# Patient Record
Sex: Female | Born: 1980 | Race: Black or African American | Hispanic: No | Marital: Single | Smoking: Never smoker
Health system: Southern US, Community
[De-identification: ages and names within clinical notes are randomized; demographics above are authoritative.]

## PROBLEM LIST (undated history)

## (undated) ENCOUNTER — Emergency Department (HOSPITAL_BASED_OUTPATIENT_CLINIC_OR_DEPARTMENT_OTHER): Admission: EM | Payer: Managed Care, Other (non HMO) | Source: Home / Self Care

## (undated) ENCOUNTER — Inpatient Hospital Stay (HOSPITAL_COMMUNITY): Payer: Self-pay

## (undated) DIAGNOSIS — Z8759 Personal history of other complications of pregnancy, childbirth and the puerperium: Secondary | ICD-10-CM

## (undated) DIAGNOSIS — Z8632 Personal history of gestational diabetes: Secondary | ICD-10-CM

## (undated) HISTORY — DX: Personal history of other complications of pregnancy, childbirth and the puerperium: Z87.59

## (undated) HISTORY — PX: NO PAST SURGERIES: SHX2092

## (undated) HISTORY — DX: Personal history of gestational diabetes: Z86.32

---

## 2014-08-06 DIAGNOSIS — F339 Major depressive disorder, recurrent, unspecified: Secondary | ICD-10-CM | POA: Insufficient documentation

## 2014-09-15 ENCOUNTER — Inpatient Hospital Stay (HOSPITAL_COMMUNITY)
Admission: AD | Admit: 2014-09-15 | Discharge: 2014-09-15 | Disposition: A | Discharge status: Home / Self Care | Payer: PRIVATE HEALTH INSURANCE | Source: Ambulatory Visit | Attending: Obstetrics and Gynecology | Admitting: Obstetrics and Gynecology

## 2014-09-15 ENCOUNTER — Encounter (HOSPITAL_COMMUNITY): Payer: Self-pay | Admitting: *Deleted

## 2014-09-15 DIAGNOSIS — O26892 Other specified pregnancy related conditions, second trimester: Secondary | ICD-10-CM | POA: Diagnosis present

## 2014-09-15 DIAGNOSIS — O219 Vomiting of pregnancy, unspecified: Secondary | ICD-10-CM

## 2014-09-15 DIAGNOSIS — Z3A15 15 weeks gestation of pregnancy: Secondary | ICD-10-CM | POA: Diagnosis not present

## 2014-09-15 DIAGNOSIS — R110 Nausea: Secondary | ICD-10-CM | POA: Insufficient documentation

## 2014-09-15 DIAGNOSIS — R109 Unspecified abdominal pain: Secondary | ICD-10-CM | POA: Diagnosis not present

## 2014-09-15 LAB — WET PREP, GENITAL
Clue Cells Wet Prep HPF POC: NONE SEEN
Trich, Wet Prep: NONE SEEN
WBC, Wet Prep HPF POC: NONE SEEN
YEAST WET PREP: NONE SEEN

## 2014-09-15 LAB — URINE MICROSCOPIC-ADD ON

## 2014-09-15 LAB — URINALYSIS, ROUTINE W REFLEX MICROSCOPIC
Bilirubin Urine: NEGATIVE
Glucose, UA: NEGATIVE mg/dL
Hgb urine dipstick: NEGATIVE
Ketones, ur: NEGATIVE mg/dL
NITRITE: NEGATIVE
PH: 7 (ref 5.0–8.0)
Protein, ur: NEGATIVE mg/dL
Specific Gravity, Urine: 1.015 (ref 1.005–1.030)
Urobilinogen, UA: 0.2 mg/dL (ref 0.0–1.0)

## 2014-09-15 LAB — POCT PREGNANCY, URINE: Preg Test, Ur: POSITIVE — AB

## 2014-09-15 LAB — HIV ANTIBODY (ROUTINE TESTING W REFLEX): HIV 1&2 Ab, 4th Generation: NONREACTIVE

## 2014-09-15 NOTE — MAU Provider Note (Signed)
History     CSN: [id number]  Arrival date and time: 09/15/14 1549   First Provider Initiated Contact with Patient 09/15/14 1635      Chief Complaint  Patient presents with  . Possible Pregnancy  . Nausea  . Abdominal Pain   HPI   Ms. Chelsea Harper is a 33 y.o. female G1P0 at 7453w0d who presents for a pregnancy test, nausea and abdominal pain. She just does not feel like doing anything since she has been pregnant. Some days are better than others. She has had some worries due to her mom passing around this time; she does not feel depressed however feels like she is dwelling on the past. She feels that her abdominal pain and nausea are related to her worrying.   OB History    Gravida Para Term Preterm AB TAB SAB Ectopic Multiple Living   1               History reviewed. No pertinent past medical history.  History reviewed. No pertinent past surgical history.  Family History  Problem Relation Age of Onset  . Diabetes Mother   . Heart disease Mother   . Cancer Mother     breast  . Hypertension Brother     History  Substance Use Topics  . Smoking status: Never Smoker   . Smokeless tobacco: Never Used  . Alcohol Use: No    Allergies: No Known Allergies  No prescriptions prior to admission   Results for orders placed or performed during the hospital encounter of 09/15/14 (from the past 48 hour(s))  Urinalysis, Routine w reflex microscopic     Status: Abnormal   Collection Time: 09/15/14  4:15 PM  Result Value Ref Range   Color, Urine YELLOW YELLOW   APPearance CLEAR CLEAR   Specific Gravity, Urine 1.015 1.005 - 1.030   pH 7.0 5.0 - 8.0   Glucose, UA NEGATIVE NEGATIVE mg/dL   Hgb urine dipstick NEGATIVE NEGATIVE   Bilirubin Urine NEGATIVE NEGATIVE   Ketones, ur NEGATIVE NEGATIVE mg/dL   Protein, ur NEGATIVE NEGATIVE mg/dL   Urobilinogen, UA 0.2 0.0 - 1.0 mg/dL   Nitrite NEGATIVE NEGATIVE   Leukocytes, UA TRACE (A) NEGATIVE  Urine microscopic-add on      Status: Abnormal   Collection Time: 09/15/14  4:15 PM  Result Value Ref Range   Squamous Epithelial / LPF FEW (A) RARE   WBC, UA 3-6 <3 WBC/hpf   RBC / HPF 0-2 <3 RBC/hpf   Bacteria, UA FEW (A) RARE  Pregnancy, urine POC     Status: Abnormal   Collection Time: 09/15/14  4:22 PM  Result Value Ref Range   Preg Test, Ur POSITIVE (A) NEGATIVE    Comment:        THE SENSITIVITY OF THIS METHODOLOGY IS >24 mIU/mL   Wet prep, genital     Status: None   Collection Time: 09/15/14  4:55 PM  Result Value Ref Range   Yeast Wet Prep HPF POC NONE SEEN NONE SEEN   Trich, Wet Prep NONE SEEN NONE SEEN   Clue Cells Wet Prep HPF POC NONE SEEN NONE SEEN   WBC, Wet Prep HPF POC NONE SEEN NONE SEEN    Comment: MODERATE BACTERIA SEEN    Review of Systems  Gastrointestinal: Positive for nausea and abdominal pain (occasional cramps; that come and goes. ). Negative for vomiting.  Psychiatric/Behavioral: Negative for depression and suicidal ideas. The patient is nervous/anxious.    Physical Exam  Blood pressure 139/82, pulse 83, temperature 98.1 F (36.7 C), temperature source Oral, resp. rate 20, height 5\' 7"  (1.702 m), weight 139.799 kg (308 lb 3.2 oz), last menstrual period 06/02/2014, SpO2 99 %.  Physical Exam  Constitutional: She is oriented to person, place, and time. She appears well-developed and well-nourished. No distress.  HENT:  Head: Normocephalic.  Eyes: Pupils are equal, round, and reactive to light.  Neck: Neck supple.  Respiratory: Effort normal.  GI: Soft. She exhibits no distension. There is no tenderness. There is no rebound.  Genitourinary:  Speculum exam: Vagina - Small amount of creamy discharge, no odor Cervix - No contact bleeding Bimanual exam: Cervix closed Uterus non tender, Gravid  Adnexa non tender, no masses bilaterally GC/Chlam, wet prep done Chaperone present for exam.   Musculoskeletal: Normal range of motion.  Neurological: She is alert and oriented to  person, place, and time.  Skin: Skin is warm. She is not diaphoretic.  Psychiatric: Her behavior is normal.    MAU Course  Procedures  None  MDM +fht  Wet prep GC HIV  Assessment and Plan   A: Nausea in pregnancy  Encounter for pregnancy test  P: Discharge home in stable condition Start prenatal care ASAP First trimester warning signs discussed Return to MAU if symptoms worsen  Debbrah AlarJennifer Irene Brandalyn Harting, NP 09/15/2014 5:34 PM

## 2014-09-15 NOTE — MAU Note (Signed)
Patient states she has had a positive home pregnancy test. States she has had nausea for 2-3 weeks but only vomited x 1. States she has been having abdominal pain on and off for about one week. Denies bleeding or discharge.

## 2014-09-15 NOTE — Discharge Instructions (Signed)

## 2014-09-16 LAB — GC/CHLAMYDIA PROBE AMP
CT PROBE, AMP APTIMA: NEGATIVE
GC Probe RNA: NEGATIVE

## 2014-09-22 ENCOUNTER — Encounter: Payer: PRIVATE HEALTH INSURANCE | Admitting: Family

## 2014-10-05 ENCOUNTER — Ambulatory Visit (INDEPENDENT_AMBULATORY_CARE_PROVIDER_SITE_OTHER): Payer: PRIVATE HEALTH INSURANCE | Admitting: Family Medicine

## 2014-10-05 ENCOUNTER — Encounter: Payer: Self-pay | Admitting: Family Medicine

## 2014-10-05 VITALS — BP 122/58 | HR 98 | Temp 98.0°F | Wt 311.5 lb

## 2014-10-05 DIAGNOSIS — Z8632 Personal history of gestational diabetes: Secondary | ICD-10-CM

## 2014-10-05 DIAGNOSIS — Z3402 Encounter for supervision of normal first pregnancy, second trimester: Secondary | ICD-10-CM

## 2014-10-05 HISTORY — DX: Personal history of gestational diabetes: Z86.32

## 2014-10-05 LAB — POCT URINALYSIS DIP (DEVICE)
Bilirubin Urine: NEGATIVE
Glucose, UA: NEGATIVE mg/dL
HGB URINE DIPSTICK: NEGATIVE
KETONES UR: NEGATIVE mg/dL
Nitrite: NEGATIVE
PH: 5 (ref 5.0–8.0)
Protein, ur: NEGATIVE mg/dL
Specific Gravity, Urine: 1.015 (ref 1.005–1.030)
Urobilinogen, UA: 0.2 mg/dL (ref 0.0–1.0)

## 2014-10-05 NOTE — Progress Notes (Signed)
C/o pressure in lower abdomen Pt states she had a normal Pap smear in May 2015 @ [facility] Initial Labs today with early 1hr gtt New OB packet given Discussed apporiate weight gain

## 2014-10-05 NOTE — Progress Notes (Signed)
   Subjective:    Chelsea Harper is a G1P0 9039w6d being seen today for her first obstetrical visit.  Her obstetrical history is significant for obesity. Patient does intend to breast feed. Pregnancy history fully reviewed.  Patient reports no bleeding, no contractions, no cramping and no leaking.  Filed Vitals:   10/05/14 1323  BP: 122/58  Pulse: 98  Temp: 98 F (36.7 C)  Weight: 311 lb 8 oz (141.295 kg)    HISTORY: OB History  Gravida Para Term Preterm AB SAB TAB Ectopic Multiple Living  1             # Outcome Date GA Lbr Len/2nd Weight Sex Delivery Anes PTL Lv  1 Current              History reviewed. No pertinent past medical history. History reviewed. No pertinent past surgical history. Family History  Problem Relation Age of Onset  . Diabetes Mother   . Heart disease Mother   . Cancer Mother     breast  . Hypertension Brother      Exam    Uterus:     Pelvic Exam: Declined  System:     Skin: normal coloration and turgor, no rashes    Neurologic: normal, gait normal; reflexes normal and symmetric   Extremities: normal strength, tone, and muscle mass   HEENT PERRLA and extra ocular movement intact   Mouth/Teeth mucous membranes moist, pharynx normal without lesions   Neck Supple, no masses   Cardiovascular: regular rate and rhythm, no murmurs or gallops   Respiratory:  appears well, vitals normal, no respiratory distress, acyanotic, normal RR, ear and throat exam is normal, neck free of mass or lymphadenopathy, chest clear, no wheezing, crepitations, rhonchi, normal symmetric air entry   Abdomen: soft, non-tender; bowel sounds normal; no masses,  no organomegaly      Assessment:    Pregnancy: G1P0 Patient Active Problem List   Diagnosis Date Noted  . Supervision of normal first pregnancy in second trimester 10/05/2014  . Morbid obesity 10/05/2014        Plan:     Initial labs drawn. Prenatal vitamins. Problem list reviewed and  updated. Genetic Screening discussed Quad Screen: ordered.  Ultrasound discussed; fetal survey: ordered.  Follow up in 4 weeks. 100% of 30 min visit spent on counseling and coordination of care.  Early 1hr GTT   STINSON, JACOB JEHIEL 10/05/2014

## 2014-10-05 NOTE — Patient Instructions (Signed)
Second Trimester of Pregnancy The second trimester is from week 13 through week 28, month 4 through 6. This is often the time in pregnancy that you feel your best. Often times, morning sickness has lessened or quit. You may have more energy, and you may get hungry more often. Your unborn baby (fetus) is growing rapidly. At the end of the sixth month, he or she is about 9 inches long and weighs about 1 pounds. You will likely feel the baby move (quickening) between 18 and 20 weeks of pregnancy. HOME CARE   Avoid all smoking, herbs, and alcohol. Avoid drugs not approved by your doctor.  Only take medicine as told by your doctor. Some medicines are safe and some are not during pregnancy.  Exercise only as told by your doctor. Stop exercising if you start having cramps.  Eat regular, healthy meals.  Wear a good support bra if your breasts are tender.  Do not use hot tubs, steam rooms, or saunas.  Wear your seat belt when driving.  Avoid raw meat, uncooked cheese, and liter boxes and soil used by cats.  Take your prenatal vitamins.  Try taking medicine that helps you poop (stool softener) as needed, and if your doctor approves. Eat more fiber by eating fresh fruit, vegetables, and whole grains. Drink enough fluids to keep your pee (urine) clear or pale yellow.  Take warm water baths (sitz baths) to soothe pain or discomfort caused by hemorrhoids. Use hemorrhoid cream if your doctor approves.  If you have puffy, bulging veins (varicose veins), wear support hose. Raise (elevate) your feet for 15 minutes, 3-4 times a day. Limit salt in your diet.  Avoid heavy lifting, wear low heals, and sit up straight.  Rest with your legs raised if you have leg cramps or low back pain.  Visit your dentist if you have not gone during your pregnancy. Use a soft toothbrush to brush your teeth. Be gentle when you floss.  You can have sex (intercourse) unless your doctor tells you not to.  Go to your  doctor visits. GET HELP IF:   You feel dizzy.  You have mild cramps or pressure in your lower belly (abdomen).  You have a nagging pain in your belly area.  You continue to feel sick to your stomach (nauseous), throw up (vomit), or have watery poop (diarrhea).  You have bad smelling fluid coming from your vagina.  You have pain with peeing (urination). GET HELP RIGHT AWAY IF:   You have a fever.  You are leaking fluid from your vagina.  You have spotting or bleeding from your vagina.  You have severe belly cramping or pain.  You lose or gain weight rapidly.  You have trouble catching your breath and have chest pain.  You notice sudden or extreme puffiness (swelling) of your face, hands, ankles, feet, or legs.  You have not felt the baby move in over an hour.  You have severe headaches that do not go away with medicine.  You have vision changes. Document Released: 01/16/2010 Document Revised: 02/16/2013 Document Reviewed: 12/23/2012 ExitCare Patient Information 2015 [employer]. This information is not intended to replace advice given to you by your health care provider. Make sure you discuss any questions you have with your health care provider.  

## 2014-10-06 ENCOUNTER — Telehealth: Payer: Self-pay | Admitting: *Deleted

## 2014-10-06 DIAGNOSIS — O280 Abnormal hematological finding on antenatal screening of mother: Secondary | ICD-10-CM

## 2014-10-06 LAB — PRENATAL PROFILE (SOLSTAS)
ANTIBODY SCREEN: NEGATIVE
Basophils Absolute: 0 10*3/uL (ref 0.0–0.1)
Basophils Relative: 0 % (ref 0–1)
EOS PCT: 2 % (ref 0–5)
Eosinophils Absolute: 0.3 10*3/uL (ref 0.0–0.7)
HCT: 31.4 % — ABNORMAL LOW (ref 36.0–46.0)
HEMOGLOBIN: 10.7 g/dL — AB (ref 12.0–15.0)
HIV 1&2 Ab, 4th Generation: NONREACTIVE
Hepatitis B Surface Ag: NEGATIVE
LYMPHS ABS: 2.5 10*3/uL (ref 0.7–4.0)
LYMPHS PCT: 18 % (ref 12–46)
MCH: 26.8 pg (ref 26.0–34.0)
MCHC: 34.1 g/dL (ref 30.0–36.0)
MCV: 78.7 fL (ref 78.0–100.0)
MONO ABS: 0.4 10*3/uL (ref 0.1–1.0)
MONOS PCT: 3 % (ref 3–12)
MPV: 9.4 fL (ref 9.4–12.4)
Neutro Abs: 10.5 10*3/uL — ABNORMAL HIGH (ref 1.7–7.7)
Neutrophils Relative %: 77 % (ref 43–77)
Platelets: 297 10*3/uL (ref 150–400)
RBC: 3.99 MIL/uL (ref 3.87–5.11)
RDW: 14.9 % (ref 11.5–15.5)
RUBELLA: 0.91 {index} — AB (ref <0.90)
Rh Type: POSITIVE
WBC: 13.7 10*3/uL — ABNORMAL HIGH (ref 4.0–10.5)

## 2014-10-06 LAB — GLUCOSE TOLERANCE, 1 HOUR (50G) W/O FASTING: GLUCOSE 1 HOUR GTT: 153 mg/dL — AB (ref 70–140)

## 2014-10-06 LAB — AFP TUMOR MARKER: AFP-Tumor Marker: 19.6 ng/mL — ABNORMAL HIGH (ref <6.1)

## 2014-10-06 NOTE — Telephone Encounter (Signed)
Also noted per chart review quad screen results came back elevated. Discussed with Dr. Adrian BlackwaterStinson - need to order genetic counseling. Order placed.and appointment 10/08/14 2:15 complete ultrsound inMFM and 3:00 genetic counseling  . [place] [facility] and left message we are callling with results - please call clinic.  Need to also schedule 3 hr . Gtt

## 2014-10-06 NOTE — Telephone Encounter (Signed)
-----   Message from Chelsea HeritageJacob J Stinson, DO sent at 10/06/2014 11:17 AM EST ----- Patient's 1 hr GTT was elevated.  Please call patient to have her come in for a 3 hr GTT.

## 2014-10-06 NOTE — Progress Notes (Signed)
Quick Note:  Patient's 1 hr GTT was elevated. Please call patient to have her come in for a 3 hr GTT. ______ 

## 2014-10-07 LAB — PRESCRIPTION MONITORING PROFILE (19 PANEL)
Amphetamine/Meth: NEGATIVE ng/mL
BUPRENORPHINE, URINE: NEGATIVE ng/mL
Barbiturate Screen, Urine: NEGATIVE ng/mL
Benzodiazepine Screen, Urine: NEGATIVE ng/mL
CANNABINOID SCRN UR: NEGATIVE ng/mL
Carisoprodol, Urine: NEGATIVE ng/mL
Cocaine Metabolites: NEGATIVE ng/mL
Creatinine, Urine: 151.96 mg/dL (ref >20.0)
ECSTASY: NEGATIVE ng/mL
Fentanyl, Ur: NEGATIVE ng/mL
MEPERIDINE UR: NEGATIVE ng/mL
METHADONE SCREEN, URINE: NEGATIVE ng/mL
METHAQUALONE SCREEN (URINE): NEGATIVE ng/mL
Nitrites, Initial: NEGATIVE ug/mL
Opiate Screen, Urine: NEGATIVE ng/mL
Oxycodone Screen, Ur: NEGATIVE ng/mL
PHENCYCLIDINE, UR: NEGATIVE ng/mL
Propoxyphene: NEGATIVE ng/mL
TAPENTADOLUR: NEGATIVE ng/mL
Tramadol Scrn, Ur: NEGATIVE ng/mL
Zolpidem, Urine: NEGATIVE ng/mL
pH, Initial: 5.9 pH (ref 4.5–8.9)

## 2014-10-07 LAB — CULTURE, OB URINE: Colony Count: >=100000

## 2014-10-07 LAB — HEMOGLOBINOPATHY EVALUATION
HGB A2 QUANT: 3.1 % (ref 2.2–3.2)
HGB A: 55 % — AB (ref 96.8–97.8)
Hemoglobin Other: 0 %
Hgb F Quant: 0.9 % (ref 0.0–2.0)
Hgb S Quant: 41 % — ABNORMAL HIGH

## 2014-10-07 NOTE — Telephone Encounter (Signed)
Called home number and left message we are calling about an appointment we have made for tomorrow- please call clinic so we can explain.  Also called work number left message with female for Chelsea Harper to call clinic.

## 2014-10-08 ENCOUNTER — Ambulatory Visit (HOSPITAL_COMMUNITY): Payer: PRIVATE HEALTH INSURANCE

## 2014-10-11 NOTE — Telephone Encounter (Signed)
Chelsea HeritageJacob J Stinson, DO  P [facility]           Can you have the patient come in to get this - Quad screen needs to be ordered. The wrong test had been ordered before.  Thanks!  Gerilyn PilgrimJacob

## 2014-10-11 NOTE — Telephone Encounter (Signed)
Per Dr. Samson FredericStinson Emrys needs both quad screen and to keep appointment for genetic counseling/ ultrasound

## 2014-10-11 NOTE — Telephone Encounter (Signed)
Patient Demographics     Patient Name Sex DOB SSN Address Phone    Chelsea Harper, Chelsea Harper Female 02/03/1981 [id number] [place] [id number] [phone] [facility](Home) [phone] (Work)      AFP tumor marker for patient  Received: Today    Chelsea Harper  Levie HeritageJacob J Stinson, DO Cc: Simona HuhLinda Lusk Tranisha Tissue, RN           Hi,   I was looking ahead and see that this patient is scheduled to come for genetic counseling with me and detailed ultrasound this Thursday. It looks like instead of a Quad screen though that she had an AFP tumor marker performed instead. I wasn't sure if somebody there would be in contact with her prior to her scheduled visit with us and if so, if the patient would prefer to come back to your office to have the Quad screen done. If not, then we can offer for her to have a Quad screen performed when she is here.   Thanks!   Clydie BraunKaren

## 2014-10-11 NOTE — Telephone Encounter (Addendum)
Called Chelsea Harper to notify her  needs quad screen, genetic counseling and ultrasound- left message for her to call clinic and ask to speak to a nurse. Also called work number and left message with female for Chelsea Harper to call clinic.

## 2014-10-12 NOTE — Telephone Encounter (Signed)
Called patient, no answer- left message that we are trying to reach you with results and information about an appt, please call us back at the clinics. Called patient's emergency contact (sister) and she states she will have [place] us

## 2014-10-12 NOTE — Telephone Encounter (Signed)
Patient called back and I informed her of 1 hr gtt results and need for 3 hr. Patient states she will have to work something out with her job and call us back. Explained importance of 3 hr and importance of being tested soon. Patient verbalized understanding and will call back to schedule. Informed patient of AFP results and appts on Thursday. Patient verbalized understanding and had no questions

## 2014-10-14 ENCOUNTER — Other Ambulatory Visit: Payer: Self-pay | Admitting: Family Medicine

## 2014-10-14 ENCOUNTER — Ambulatory Visit (HOSPITAL_COMMUNITY)
Admission: RE | Admit: 2014-10-14 | Discharge: 2014-10-14 | Disposition: A | Discharge status: Home / Self Care | Payer: PRIVATE HEALTH INSURANCE | Source: Ambulatory Visit | Attending: Family Medicine | Admitting: Family Medicine

## 2014-10-14 ENCOUNTER — Ambulatory Visit (HOSPITAL_COMMUNITY): Payer: PRIVATE HEALTH INSURANCE

## 2014-10-14 ENCOUNTER — Encounter (HOSPITAL_COMMUNITY): Payer: Self-pay

## 2014-10-14 DIAGNOSIS — Z36 Encounter for antenatal screening of mother: Secondary | ICD-10-CM | POA: Diagnosis not present

## 2014-10-14 DIAGNOSIS — O352XX1 Maternal care for (suspected) hereditary disease in fetus, fetus 1: Secondary | ICD-10-CM

## 2014-10-14 DIAGNOSIS — O280 Abnormal hematological finding on antenatal screening of mother: Secondary | ICD-10-CM | POA: Insufficient documentation

## 2014-10-14 DIAGNOSIS — O99212 Obesity complicating pregnancy, second trimester: Secondary | ICD-10-CM | POA: Diagnosis not present

## 2014-10-14 DIAGNOSIS — D573 Sickle-cell trait: Secondary | ICD-10-CM | POA: Insufficient documentation

## 2014-10-14 DIAGNOSIS — Z3402 Encounter for supervision of normal first pregnancy, second trimester: Secondary | ICD-10-CM

## 2014-10-14 DIAGNOSIS — Z3A19 19 weeks gestation of pregnancy: Secondary | ICD-10-CM | POA: Insufficient documentation

## 2014-10-14 DIAGNOSIS — O99012 Anemia complicating pregnancy, second trimester: Secondary | ICD-10-CM | POA: Insufficient documentation

## 2014-10-14 DIAGNOSIS — Z315 Encounter for genetic counseling: Secondary | ICD-10-CM | POA: Diagnosis not present

## 2014-10-14 DIAGNOSIS — Z3A18 18 weeks gestation of pregnancy: Secondary | ICD-10-CM | POA: Insufficient documentation

## 2014-10-14 DIAGNOSIS — O99210 Obesity complicating pregnancy, unspecified trimester: Secondary | ICD-10-CM | POA: Insufficient documentation

## 2014-10-14 LAB — QUAD SCREEN FOR MFM

## 2014-10-14 NOTE — Progress Notes (Signed)
Genetic Counseling  High-Risk Gestation Note  Appointment Date:  10/14/2014 Referred By: Truett Mainland, DO Date of Birth:  [birth year]   Pregnancy History: G1P0 Estimated Date of Delivery: 03/09/15 Estimated Gestational Age: 66w1dAttending: PBenjaman Lobe MD  I met with Chelsea Harper for genetic counseling given that she has sickle cell trait. She was accompanied to today's visit by her sister and niece. She was also referred given her AFP lab results.  [name] referred for an elevated AFP. However, we discussed that AFP tumor marker was analyzed, which is not recommended in pregnancy, and not maternal serum AFP/Quad screen, which can assess the risk for birth defects and chromosome conditions in the pregnancy. Thus, she is not considered to have an abnormal maternal serum AFP result in the pregnancy. We discussed the option of Quad screen including the conditions for which it screens, detection rates, and false positive rates. She understands that Quad screening is not diagnostic nor does it assess for all birth defects or genetic conditions. After careful consideration, she elected to proceed with Quad screen today.   We discussed that hemoglobin electrophoresis performed indicated that Chelsea Harper has sickle cell trait. She reported that she is aware that she has sickle cell trait.  We discussed that Sickle Cell anemia (SCA) is a hemoglobinopathy in which there is an inherited structural abnormality in one of the globin chains, specifically the beta globin chain.  It is characterized by episodes of vaso-occlusive crisis and chronic anemia due to the tendency of red blood cells to become deformed under conditions of decreased oxygen tension.  The basic defect in SCA involves the change of a single amino acid altering the configuration of the hemoglobin molecule causing the cells to sickle and to obstruct blood flow in small vessels.  Ischemia of tissues and organs results.  Increased cell  fragility with increased phagocytosis and splenic sequestration of the fragile cells produces anemia.    SCA (Hgb SS) is inherited as an autosomal recessive condition.  The carrier state is Hgb AS. Carriers of recessive conditions typically do not have symptoms related to the condition because they still have one functioning copy of the gene, and thus some production of the typical protein coded for by that gene. We discussed the 1 in 4 (25%) chance with each pregnancy to have a child with SCA when both parents are carriers for sickle cell trait or other hemoglobin variant.  There is also a 1 in 4 chance to have a child who is not a carrier, or affected with SCA (Hgb AA) and a 1 in 2 chance to have a child who is a carrier for SCA (Hgb AS).  We reviewed that early diagnosis of SCA is facilitated by newborn screening before the onset of symptoms.  Clinical manifestations usually present in the first or second year of life.  We discussed the option of amniocentesis as a way to determine, prenatally, if a baby has SCA or not, when both parents are identified to be carriers.  A risk of 1 in 300-500 was given for amniocentesis, the primary complication being spontaneous pregnancy loss, or at this late gestation, preterm delivery.  She understands that although the ultrasound may appear normal, the risk of anomalies cannot be completely eliminated, and that a baby with SCA would not appear different on a prenatal ultrasound.    Given the recessive inheritance, we discussed the importance of understanding the carrier status of the father of the pregnancy in order to  accurately predict the risk of a hemoglobinopathy in the fetus. The patient did not have information regarding whether or not he has had sickle cell screening in the past. She did not know of any relatives in his family with sickle cell trait or sickle cell anemia. He reportedly has African American ancestry, and consanguinity to Chelsea Harper was denied.  Prior to carrier screening for the father of the pregnancy, he would have the general population risk of approximately 1 in 12 to carry sickle cell trait. Thus, the risk for sickle cell anemia in the pregnancy, prior to carrier screening for him, is approximately 1 in 38. We discussed that screening for sickle cell trait and other hemoglobin variants, such as hemoglobin C, is available to him via hemoglobin electrophoresis. Chelsea Harper may contact us if the father of the pregnancy would like [place] to facilitate sickle cell screening for him.    Both family histories were reviewed and found to be otherwise noncontributory for birth defects, intellectual disability, and known genetic conditions.  Without further information regarding the provided family history, an accurate genetic risk cannot be calculated. Further genetic counseling is warranted if more information is obtained.  Chelsea Harper denied exposure to environmental toxins or chemical agents. She denied the use of alcohol, tobacco or street drugs. She denied significant viral illnesses during the course of her pregnancy. Her medical and surgical histories were noncontributory.   I counseled Chelsea Harper for approximately 30 minutes regarding the above risks and available options.    Chipper Oman, MS Certified Genetic Counselor 10/14/2014

## 2014-10-15 ENCOUNTER — Other Ambulatory Visit: Payer: PRIVATE HEALTH INSURANCE

## 2014-10-18 ENCOUNTER — Other Ambulatory Visit: Payer: PRIVATE HEALTH INSURANCE

## 2014-10-18 DIAGNOSIS — O99810 Abnormal glucose complicating pregnancy: Secondary | ICD-10-CM

## 2014-10-19 LAB — GLUCOSE TOLERANCE, 3 HOURS
GLUCOSE, 1 HOUR-GESTATIONAL: 149 mg/dL (ref 70–189)
GLUCOSE, 2 HOUR-GESTATIONAL: 157 mg/dL (ref 70–164)
Glucose Tolerance, Fasting: 88 mg/dL (ref 70–104)
Glucose, GTT - 3 Hour: 117 mg/dL (ref 70–144)

## 2014-10-21 ENCOUNTER — Telehealth (HOSPITAL_COMMUNITY): Payer: Self-pay | Admitting: MS"

## 2014-10-21 NOTE — Telephone Encounter (Signed)
Attempted to call patient regarding screen negative Quad screen result. Left message for patient to return call.   Clydie BraunKaren Shareeka Yim 10/21/2014 2:30 PM

## 2014-10-22 ENCOUNTER — Other Ambulatory Visit (HOSPITAL_COMMUNITY): Payer: Self-pay

## 2014-11-01 ENCOUNTER — Telehealth: Payer: Self-pay

## 2014-11-01 NOTE — Telephone Encounter (Signed)
Pt called and stated that she had a question about her lab work that was done last week. Called pt and went straight to VM.  LM that I am returning her call to please give the office a return call back.

## 2014-11-02 ENCOUNTER — Encounter: Payer: PRIVATE HEALTH INSURANCE | Admitting: Family Medicine

## 2014-11-02 NOTE — Telephone Encounter (Signed)
Called patient, no answer- left message that we are trying to return your phone call and if you still need assistance, please call us back at the clinics

## 2014-11-05 NOTE — L&D Delivery Note (Signed)
Patient is 34 y.o. G1P0 7085w0d admitted IOL 2/2 gHTN and A1GDM  Delivery Note At 3:14 AM a viable female was delivered via Vaginal, Spontaneous Delivery (Presentation: Right Occiput Anterior).  APGAR: 4, 9; weight pending.  Infant placed on mother's abdomen, stimulated and dried.  Baby with poor tone and slow to rouse initially.  Cord quickly clamped and cut by Dr Adrian BlackwaterStinson.  Baby carefully rushed to the warmer for additional stimulation.   Baby was well by 5 minutes.  Hospital cord blood sample collected.  Placenta delivered, fundal massage applied and vagina inspected for lacerations.  Placenta status: intact.  Cord: 3 vessels with the following complications: loose nuchal x1.  Dr Adrian BlackwaterStinson and Wynelle BourgeoisMarie Harper, CNM gloved and actively participated in the entirety of the delivery.  Anesthesia: Epidural  Episiotomy: None Lacerations: None Suture Repair: n/a Est. Blood Loss (mL):  150cc  Mom to postpartum.  Baby to Couplet care / Skin to Skin.  Chelsea Harper, Chelsea Harper, Chelsea Harper 03/02/2015, 3:42 AM  Delivery also attended by myself and Dr Adrian BlackwaterStinson No difficulty with shoulders Agree with note Aviva SignsMarie L Harper, CNM

## 2014-11-11 ENCOUNTER — Encounter: Payer: Self-pay | Admitting: Obstetrics and Gynecology

## 2014-11-11 ENCOUNTER — Ambulatory Visit (INDEPENDENT_AMBULATORY_CARE_PROVIDER_SITE_OTHER): Payer: PRIVATE HEALTH INSURANCE | Admitting: Obstetrics and Gynecology

## 2014-11-11 ENCOUNTER — Ambulatory Visit (HOSPITAL_COMMUNITY)
Admission: RE | Admit: 2014-11-11 | Discharge: 2014-11-11 | Disposition: A | Discharge status: Home / Self Care | Payer: PRIVATE HEALTH INSURANCE | Source: Ambulatory Visit | Attending: Family Medicine | Admitting: Family Medicine

## 2014-11-11 ENCOUNTER — Encounter (HOSPITAL_COMMUNITY): Payer: Self-pay

## 2014-11-11 VITALS — BP 121/57 | HR 87 | Wt 312.9 lb

## 2014-11-11 DIAGNOSIS — Z3402 Encounter for supervision of normal first pregnancy, second trimester: Secondary | ICD-10-CM

## 2014-11-11 DIAGNOSIS — Z36 Encounter for antenatal screening of mother: Secondary | ICD-10-CM | POA: Diagnosis not present

## 2014-11-11 DIAGNOSIS — Z3A23 23 weeks gestation of pregnancy: Secondary | ICD-10-CM | POA: Insufficient documentation

## 2014-11-11 DIAGNOSIS — D573 Sickle-cell trait: Secondary | ICD-10-CM | POA: Diagnosis not present

## 2014-11-11 DIAGNOSIS — O352XX1 Maternal care for (suspected) hereditary disease in fetus, fetus 1: Secondary | ICD-10-CM

## 2014-11-11 DIAGNOSIS — O99212 Obesity complicating pregnancy, second trimester: Secondary | ICD-10-CM | POA: Diagnosis not present

## 2014-11-11 DIAGNOSIS — O99012 Anemia complicating pregnancy, second trimester: Secondary | ICD-10-CM | POA: Insufficient documentation

## 2014-11-11 DIAGNOSIS — IMO0002 Reserved for concepts with insufficient information to code with codable children: Secondary | ICD-10-CM | POA: Insufficient documentation

## 2014-11-11 DIAGNOSIS — O352XX0 Maternal care for (suspected) hereditary disease in fetus, not applicable or unspecified: Secondary | ICD-10-CM | POA: Insufficient documentation

## 2014-11-11 DIAGNOSIS — Z0489 Encounter for examination and observation for other specified reasons: Secondary | ICD-10-CM

## 2014-11-11 LAB — POCT URINALYSIS DIP (DEVICE)
Bilirubin Urine: NEGATIVE
Glucose, UA: NEGATIVE mg/dL
Hgb urine dipstick: NEGATIVE
KETONES UR: NEGATIVE mg/dL
NITRITE: NEGATIVE
PH: 6 (ref 5.0–8.0)
Protein, ur: NEGATIVE mg/dL
Specific Gravity, Urine: 1.015 (ref 1.005–1.030)
UROBILINOGEN UA: 0.2 mg/dL (ref 0.0–1.0)

## 2014-11-11 NOTE — Progress Notes (Signed)
Reviewed labs and US. 3 hr OGTTwas WNL.  F/U anatomy today. Works [employer],.Not involved with FOB but has support. Advised to have him check sickle carrier status.

## 2014-11-11 NOTE — Patient Instructions (Signed)
Second Trimester of Pregnancy The second trimester is from week 13 through week 28, months 4 through 6. The second trimester is often a time when you feel your best. Your body has also adjusted to being pregnant, and you begin to feel better physically. Usually, morning sickness has lessened or quit completely, you may have more energy, and you may have an increase in appetite. The second trimester is also a time when the fetus is growing rapidly. At the end of the sixth month, the fetus is about 9 inches long and weighs about 1 pounds. You will likely begin to feel the baby move (quickening) between 18 and 20 weeks of the pregnancy. BODY CHANGES Your body goes through many changes during pregnancy. The changes vary from woman to woman.   Your weight will continue to increase. You will notice your lower abdomen bulging out.  You may begin to get stretch marks on your hips, abdomen, and breasts.  You may develop headaches that can be relieved by medicines approved by your health care provider.  You may urinate more often because the fetus is pressing on your bladder.  You may develop or continue to have heartburn as a result of your pregnancy.  You may develop constipation because certain hormones are causing the muscles that push waste through your intestines to slow down.  You may develop hemorrhoids or swollen, bulging veins (varicose veins).  You may have back pain because of the weight gain and pregnancy hormones relaxing your joints between the bones in your pelvis and as a result of a shift in weight and the muscles that support your balance.  Your breasts will continue to grow and be tender.  Your gums may bleed and may be sensitive to brushing and flossing.  Dark spots or blotches (chloasma, mask of pregnancy) may develop on your face. This will likely fade after the baby is born.  A dark line from your belly button to the pubic area (linea nigra) may appear. This will likely fade  after the baby is born.  You may have changes in your hair. These can include thickening of your hair, rapid growth, and changes in texture. Some women also have hair loss during or after pregnancy, or hair that feels dry or thin. Your hair will most likely return to normal after your baby is born. WHAT TO EXPECT AT YOUR PRENATAL VISITS During a routine prenatal visit:  You will be weighed to make sure you and the fetus are growing normally.  Your blood pressure will be taken.  Your abdomen will be measured to track your baby's growth.  The fetal heartbeat will be listened to.  Any test results from the previous visit will be discussed. Your health care provider may ask you:  How you are feeling.  If you are feeling the baby move.  If you have had any abnormal symptoms, such as leaking fluid, bleeding, severe headaches, or abdominal cramping.  If you have any questions. Other tests that may be performed during your second trimester include:  Blood tests that check for:  Low iron levels (anemia).  Gestational diabetes (between 24 and 28 weeks).  Rh antibodies.  Urine tests to check for infections, diabetes, or protein in the urine.  An ultrasound to confirm the proper growth and development of the baby.  An amniocentesis to check for possible genetic problems.  Fetal screens for spina bifida and Down syndrome. HOME CARE INSTRUCTIONS   Avoid all smoking, herbs, alcohol, and unprescribed   drugs. These chemicals affect the formation and growth of the baby.  Follow your health care provider's instructions regarding medicine use. There are medicines that are either safe or unsafe to take during pregnancy.  Exercise only as directed by your health care provider. Experiencing uterine cramps is a good sign to stop exercising.  Continue to eat regular, healthy meals.  Wear a good support bra for breast tenderness.  Do not use hot tubs, steam rooms, or saunas.  Wear your  seat belt at all times when driving.  Avoid raw meat, uncooked cheese, cat litter boxes, and soil used by cats. These carry germs that can cause birth defects in the baby.  Take your prenatal vitamins.  Try taking a stool softener (if your health care provider approves) if you develop constipation. Eat more high-fiber foods, such as fresh vegetables or fruit and whole grains. Drink plenty of fluids to keep your urine clear or pale yellow.  Take warm sitz baths to soothe any pain or discomfort caused by hemorrhoids. Use hemorrhoid cream if your health care provider approves.  If you develop varicose veins, wear support hose. Elevate your feet for 15 minutes, 3-4 times a day. Limit salt in your diet.  Avoid heavy lifting, wear low heel shoes, and practice good posture.  Rest with your legs elevated if you have leg cramps or low back pain.  Visit your dentist if you have not gone yet during your pregnancy. Use a soft toothbrush to brush your teeth and be gentle when you floss.  A sexual relationship may be continued unless your health care provider directs you otherwise.  Continue to go to all your prenatal visits as directed by your health care provider. SEEK MEDICAL CARE IF:   You have dizziness.  You have mild pelvic cramps, pelvic pressure, or nagging pain in the abdominal area.  You have persistent nausea, vomiting, or diarrhea.  You have a bad smelling vaginal discharge.  You have pain with urination. SEEK IMMEDIATE MEDICAL CARE IF:   You have a fever.  You are leaking fluid from your vagina.  You have spotting or bleeding from your vagina.  You have severe abdominal cramping or pain.  You have rapid weight gain or loss.  You have shortness of breath with chest pain.  You notice sudden or extreme swelling of your face, hands, ankles, feet, or legs.  You have not felt your baby move in over an hour.  You have severe headaches that do not go away with  medicine.  You have vision changes. Document Released: 10/16/2001 Document Revised: 10/27/2013 Document Reviewed: 12/23/2012 Exit[employer] Patient Information 2015 [employer]. This information is not intended to replace advice given to you by your health care provider. Make sure you discuss any questions you have with your health care provider.  

## 2014-12-09 ENCOUNTER — Ambulatory Visit (INDEPENDENT_AMBULATORY_CARE_PROVIDER_SITE_OTHER): Payer: PRIVATE HEALTH INSURANCE | Admitting: Family Medicine

## 2014-12-09 VITALS — BP 121/67 | HR 101 | Temp 97.8°F | Wt 314.2 lb

## 2014-12-09 DIAGNOSIS — Z3402 Encounter for supervision of normal first pregnancy, second trimester: Secondary | ICD-10-CM

## 2014-12-09 LAB — POCT URINALYSIS DIP (DEVICE)
BILIRUBIN URINE: NEGATIVE
GLUCOSE, UA: NEGATIVE mg/dL
Hgb urine dipstick: NEGATIVE
KETONES UR: NEGATIVE mg/dL
Nitrite: NEGATIVE
PH: 6 (ref 5.0–8.0)
Protein, ur: NEGATIVE mg/dL
Specific Gravity, Urine: 1.015 (ref 1.005–1.030)
Urobilinogen, UA: 0.2 mg/dL (ref 0.0–1.0)

## 2014-12-09 LAB — CBC
HCT: 31.6 % — ABNORMAL LOW (ref 36.0–46.0)
HEMOGLOBIN: 10.4 g/dL — AB (ref 12.0–15.0)
MCH: 27.4 pg (ref 26.0–34.0)
MCHC: 32.9 g/dL (ref 30.0–36.0)
MCV: 83.4 fL (ref 78.0–100.0)
MPV: 9.2 fL (ref 8.6–12.4)
Platelets: 290 10*3/uL (ref 150–400)
RBC: 3.79 MIL/uL — AB (ref 3.87–5.11)
RDW: 15 % (ref 11.5–15.5)
WBC: 12.8 10*3/uL — AB (ref 4.0–10.5)

## 2014-12-09 MED ORDER — TETANUS-DIPHTH-ACELL PERTUSSIS 5-2.5-18.5 LF-MCG/0.5 IM SUSP: 0.5000 mL | Freq: Once | INTRAMUSCULAR | Status: DC

## 2014-12-09 NOTE — Addendum Note (Signed)
Addended by: Gerome ApleyZEYFANG, Rodgers Likes L on: 12/09/2014 12:19 PM   Modules accepted: Orders

## 2014-12-09 NOTE — Progress Notes (Signed)
C/o clear discharge.

## 2014-12-09 NOTE — Progress Notes (Signed)
Patient is 34 y.o. G1P0 11058w1d.  +FM, denies LOF, VB, contractions, vaginal discharge.  Overall feeling well. - has growth sono scheduled

## 2014-12-10 LAB — HIV ANTIBODY (ROUTINE TESTING W REFLEX): HIV 1&2 Ab, 4th Generation: NONREACTIVE

## 2014-12-10 LAB — RPR

## 2014-12-10 LAB — GLUCOSE TOLERANCE, 1 HOUR (50G) W/O FASTING: GLUCOSE 1 HOUR GTT: 160 mg/dL — AB (ref 70–140)

## 2014-12-13 ENCOUNTER — Telehealth: Payer: Self-pay | Admitting: *Deleted

## 2014-12-13 NOTE — Telephone Encounter (Signed)
-----   Message from Perry MountKristy Rocio Acosta, MD sent at 12/11/2014  3:39 PM EST ----- Needs 2h gtt, 2nd 1h screening also abnormal (1st was abnormal with normal 2h)  ----- Message -----    From: [facility]    Sent: 12/09/2014  10:58 PM      To: Perry MountKristy Rocio Acosta, MD

## 2014-12-13 NOTE — Telephone Encounter (Signed)
Called Chelsea Harper and left a message we are calling with some information, please call clinic.

## 2014-12-13 NOTE — Telephone Encounter (Signed)
Chelsea Harper called back, explained to her she did not pass the 1 hour glucose test again and needs to repeat the 3 hr glucose test. Explained must be npo since midnight night before. She states the first date she has that she come to do this test is 12/23/14 at 8am.

## 2014-12-23 ENCOUNTER — Ambulatory Visit (HOSPITAL_COMMUNITY): Payer: PRIVATE HEALTH INSURANCE

## 2014-12-23 ENCOUNTER — Other Ambulatory Visit: Payer: PRIVATE HEALTH INSURANCE

## 2014-12-23 DIAGNOSIS — R7309 Other abnormal glucose: Secondary | ICD-10-CM

## 2014-12-24 LAB — GLUCOSE TOLERANCE, 3 HOURS
GLUCOSE, 1 HOUR-GESTATIONAL: 180 mg/dL (ref 70–189)
GLUCOSE, 2 HOUR-GESTATIONAL: 178 mg/dL — AB (ref 70–164)
Glucose Tolerance, Fasting: 106 mg/dL — ABNORMAL HIGH (ref 70–104)
Glucose, GTT - 3 Hour: 161 mg/dL — ABNORMAL HIGH (ref 70–144)

## 2014-12-27 ENCOUNTER — Ambulatory Visit: Payer: PRIVATE HEALTH INSURANCE

## 2015-01-03 ENCOUNTER — Encounter: Payer: Self-pay | Admitting: Obstetrics and Gynecology

## 2015-01-03 ENCOUNTER — Encounter: Payer: PRIVATE HEALTH INSURANCE | Admitting: Obstetrics and Gynecology

## 2015-01-04 ENCOUNTER — Encounter: Payer: Self-pay | Admitting: General Practice

## 2015-01-06 ENCOUNTER — Ambulatory Visit (INDEPENDENT_AMBULATORY_CARE_PROVIDER_SITE_OTHER): Payer: PRIVATE HEALTH INSURANCE | Admitting: Obstetrics & Gynecology

## 2015-01-06 ENCOUNTER — Encounter: Payer: PRIVATE HEALTH INSURANCE | Admitting: Obstetrics & Gynecology

## 2015-01-06 ENCOUNTER — Encounter (HOSPITAL_COMMUNITY): Payer: Self-pay

## 2015-01-06 ENCOUNTER — Ambulatory Visit (HOSPITAL_COMMUNITY)
Admission: RE | Admit: 2015-01-06 | Discharge: 2015-01-06 | Disposition: A | Discharge status: Home / Self Care | Payer: PRIVATE HEALTH INSURANCE | Source: Ambulatory Visit | Attending: Family Medicine | Admitting: Family Medicine

## 2015-01-06 VITALS — BP 114/52 | HR 91 | Temp 97.7°F | Wt 317.9 lb

## 2015-01-06 DIAGNOSIS — IMO0002 Reserved for concepts with insufficient information to code with codable children: Secondary | ICD-10-CM

## 2015-01-06 DIAGNOSIS — O99013 Anemia complicating pregnancy, third trimester: Secondary | ICD-10-CM | POA: Diagnosis not present

## 2015-01-06 DIAGNOSIS — D573 Sickle-cell trait: Secondary | ICD-10-CM | POA: Diagnosis not present

## 2015-01-06 DIAGNOSIS — Z0489 Encounter for examination and observation for other specified reasons: Secondary | ICD-10-CM

## 2015-01-06 DIAGNOSIS — O99212 Obesity complicating pregnancy, second trimester: Secondary | ICD-10-CM

## 2015-01-06 DIAGNOSIS — O24913 Unspecified diabetes mellitus in pregnancy, third trimester: Secondary | ICD-10-CM

## 2015-01-06 DIAGNOSIS — O99213 Obesity complicating pregnancy, third trimester: Secondary | ICD-10-CM | POA: Insufficient documentation

## 2015-01-06 DIAGNOSIS — Z3A31 31 weeks gestation of pregnancy: Secondary | ICD-10-CM | POA: Insufficient documentation

## 2015-01-06 LAB — POCT URINALYSIS DIP (DEVICE)
Bilirubin Urine: NEGATIVE
GLUCOSE, UA: NEGATIVE mg/dL
Ketones, ur: NEGATIVE mg/dL
NITRITE: NEGATIVE
PH: 6 (ref 5.0–8.0)
PROTEIN: NEGATIVE mg/dL
SPECIFIC GRAVITY, URINE: 1.015 (ref 1.005–1.030)
Urobilinogen, UA: 0.2 mg/dL (ref 0.0–1.0)

## 2015-01-06 NOTE — Progress Notes (Signed)
Nutrition note: GDM diet education Pt has h/o obesity & is a newly diagnosed GDM pt. Pt has gained 17.9# @ 10413w1d, which is > expected. Pt reports eating 3 meals & 3-4 snacks/d. Pt is taking a PNV. Pt reports nausea & heartburn occ but no vomiting. NKFA. 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 Pt received verbal & written education on GDM diet. Pt to f/u with Harriett SineNancy (diabetes educator) on Monday to get meter/ learn how to check BS. Discussed wt gain goals of 11-20# or 0.5#/wk. Pt agrees to follow GDM diet with 3 meals & 3 snacks/d with proper CHO/ protein combination. Pt has WIC & plans to BF. F/u in 2-4 wks Blondell RevealLaura Tannar Broker, MS, RD, LDN, [facility]

## 2015-01-06 NOTE — Progress Notes (Signed)
Needs diabetic teaching

## 2015-01-06 NOTE — Progress Notes (Signed)
States feels queasy in the morning sometimes- started last week.

## 2015-01-06 NOTE — Patient Instructions (Signed)
Gestational Diabetes Mellitus Gestational diabetes mellitus, often simply referred to as gestational diabetes, is a type of diabetes that some women develop during pregnancy. In gestational diabetes, the pancreas does not make enough insulin (a hormone), the cells are less responsive to the insulin that is made (insulin resistance), or both.Normally, insulin moves sugars from food into the tissue cells. The tissue cells use the sugars for energy. The lack of insulin or the lack of normal response to insulin causes excess sugars to build up in the blood instead of going into the tissue cells. As a result, high blood sugar (hyperglycemia) develops. The effect of high sugar (glucose) levels can cause many problems.  RISK FACTORS You have an increased chance of developing gestational diabetes if you have a family history of diabetes and also have one or more of the following risk factors:  A body mass index over 30 (obesity).  A previous pregnancy with gestational diabetes.  An older age at the time of pregnancy. If blood glucose levels are kept in the normal range during pregnancy, women can have a healthy pregnancy. If your blood glucose levels are not well controlled, there may be risks to you, your unborn baby (fetus), your labor and delivery, or your newborn baby.  SYMPTOMS  If symptoms are experienced, they are much like symptoms you would normally expect during pregnancy. The symptoms of gestational diabetes include:   Increased thirst (polydipsia).  Increased urination (polyuria).  Increased urination during the night (nocturia).  Weight loss. This weight loss may be rapid.  Frequent, recurring infections.  Tiredness (fatigue).  Weakness.  Vision changes, such as blurred vision.  Fruity smell to your breath.  Abdominal pain. DIAGNOSIS Diabetes is diagnosed when blood glucose levels are increased. Your blood glucose level may be checked by one or more of the following blood  tests:  A fasting blood glucose test. You will not be allowed to eat for at least 8 hours before a blood sample is taken.  A random blood glucose test. Your blood glucose is checked at any time of the day regardless of when you ate.  A hemoglobin A1c blood glucose test. A hemoglobin A1c test provides information about blood glucose control over the previous 3 months.  An oral glucose tolerance test (OGTT). Your blood glucose is measured after you have not eaten (fasted) for 1-3 hours and then after you drink a glucose-containing beverage. Since the hormones that cause insulin resistance are highest at about 24-28 weeks of a pregnancy, an OGTT is usually performed during that time. If you have risk factors for gestational diabetes, your health care provider may test you for gestational diabetes earlier than 24 weeks of pregnancy. TREATMENT   You will need to take diabetes medicine or insulin daily to keep blood glucose levels in the desired range.  You will need to match insulin dosing with exercise and healthy food choices. The treatment goal is to maintain the before-meal (preprandial), bedtime, and overnight blood glucose level at 60-99 mg/dL during pregnancy. The treatment goal is to further maintain peak after-meal blood sugar (postprandial glucose) level at 100-140 mg/dL. HOME CARE INSTRUCTIONS   Have your hemoglobin A1c level checked twice a year.  Perform daily blood glucose monitoring as directed by your health care provider. It is common to perform frequent blood glucose monitoring.  Monitor urine ketones when you are ill and as directed by your health care provider.  Take your diabetes medicine and insulin as directed by your health care provider   to maintain your blood glucose level in the desired range.  Never run out of diabetes medicine or insulin. It is needed every day.  Adjust insulin based on your intake of carbohydrates. Carbohydrates can raise blood glucose levels but  need to be included in your diet. Carbohydrates provide vitamins, minerals, and fiber which are an essential part of a healthy diet. Carbohydrates are found in fruits, vegetables, whole grains, dairy products, legumes, and foods containing added sugars.  Eat healthy foods. Alternate 3 meals with 3 snacks.  Maintain a healthy weight gain. The usual total expected weight gain varies according to your prepregnancy body mass index (BMI).  Carry a medical alert card or wear your medical alert jewelry.  Carry a 15-gram carbohydrate snack with you at all times to treat low blood glucose (hypoglycemia). Some examples of 15-gram carbohydrate snacks include:  Glucose tablets, 3 or 4.  Glucose gel, 15-gram tube.  Raisins, 2 tablespoons (24 g).  Jelly beans, 6.  Animal crackers, 8.  Fruit juice, regular soda, or low-fat milk, 4 ounces (120 mL).  Gummy treats, 9.  Recognize hypoglycemia. Hypoglycemia during pregnancy occurs with blood glucose levels of 60 mg/dL and below. The risk for hypoglycemia increases when fasting or skipping meals, during or after intense exercise, and during sleep. Hypoglycemia symptoms can include:  Tremors or shakes.  Decreased ability to concentrate.  Sweating.  Increased heart rate.  Headache.  Dry mouth.  Hunger.  Irritability.  Anxiety.  Restless sleep.  Altered speech or coordination.  Confusion.  Treat hypoglycemia promptly. If you are alert and able to safely swallow, follow the 15:15 rule:  Take 15-20 grams of rapid-acting glucose or carbohydrate. Rapid-acting options include glucose gel, glucose tablets, or 4 ounces (120 mL) of fruit juice, regular soda, or low-fat milk.  Check your blood glucose level 15 minutes after taking the glucose.  Take 15-20 grams more of glucose if the repeat blood glucose level is still 70 mg/dL or below.  Eat a meal or snack within 1 hour once blood glucose levels return to normal.  Be alert to polyuria  (excess urination) and polydipsia (excess thirst) which are early signs of hyperglycemia. An early awareness of hyperglycemia allows for prompt treatment. Treat hyperglycemia as directed by your health care provider.  Engage in at least 30 minutes of physical activity a day or as directed by your health care provider. Ten minutes of physical activity timed 30 minutes after each meal is encouraged to control postprandial blood glucose levels.  Adjust your insulin dosing and food intake as needed if you start a new exercise or sport.  Follow your sick-day plan at any time you are unable to eat or drink as usual.  Avoid tobacco and alcohol use.  Keep all follow-up visits as directed by your health care provider.  Follow the advice of your health care provider regarding your prenatal and post-delivery (postpartum) appointments, meal planning, exercise, medicines, vitamins, blood tests, other medical tests, and physical activities.  Perform daily skin and foot care. Examine your skin and feet daily for cuts, bruises, redness, nail problems, bleeding, blisters, or sores.  Brush your teeth and gums at least twice a day and floss at least once a day. Follow up with your dentist regularly.  Schedule an eye exam during the first trimester of your pregnancy or as directed by your health care provider.  Share your diabetes management plan with your workplace or school.  Stay up-to-date with immunizations.  Learn to manage stress.    Obtain ongoing diabetes education and support as needed.  Learn about and consider breastfeeding your baby.  You should have your blood sugar level checked 6-12 weeks after delivery. This is done with an oral glucose tolerance test (OGTT). SEEK MEDICAL CARE IF:   You are unable to eat food or drink fluids for more than 6 hours.  You have nausea and vomiting for more than 6 hours.  You have a blood glucose level of 200 mg/dL and you have ketones in your  urine.  There is a change in mental status.  You develop vision problems.  You have a persistent headache.  You have upper abdominal pain or discomfort.  You develop an additional serious illness.  You have diarrhea for more than 6 hours.  You have been sick or have had a fever for a couple of days and are not getting better. SEEK IMMEDIATE MEDICAL CARE IF:   You have difficulty breathing.  You no longer feel the baby moving.  You are bleeding or have discharge from your vagina.  You start having premature contractions or labor. MAKE SURE YOU:  Understand these instructions.  Will watch your condition.  Will get help right away if you are not doing well or get worse. Document Released: 01/28/2001 Document Revised: 03/08/2014 Document Reviewed: 05/20/2012 Exit[employer] Patient Information 2015 [employer]. This information is not intended to replace advice given to you by your health care provider. Make sure you discuss any questions you have with your health care provider.  

## 2015-01-10 ENCOUNTER — Ambulatory Visit: Payer: PRIVATE HEALTH INSURANCE | Admitting: *Deleted

## 2015-01-10 NOTE — Progress Notes (Signed)
Failed for appointment for glucose testing instruction

## 2015-01-19 ENCOUNTER — Other Ambulatory Visit (HOSPITAL_COMMUNITY): Payer: Self-pay | Admitting: Obstetrics and Gynecology

## 2015-01-19 DIAGNOSIS — O352XX0 Maternal care for (suspected) hereditary disease in fetus, not applicable or unspecified: Secondary | ICD-10-CM

## 2015-01-19 DIAGNOSIS — O99210 Obesity complicating pregnancy, unspecified trimester: Principal | ICD-10-CM

## 2015-01-24 ENCOUNTER — Encounter: Payer: Medicaid Other | Attending: Family Medicine | Admitting: *Deleted

## 2015-01-24 ENCOUNTER — Ambulatory Visit (INDEPENDENT_AMBULATORY_CARE_PROVIDER_SITE_OTHER): Payer: PRIVATE HEALTH INSURANCE | Admitting: Family Medicine

## 2015-01-24 VITALS — BP 110/64 | HR 90 | Temp 97.5°F | Wt 318.5 lb

## 2015-01-24 DIAGNOSIS — Z713 Dietary counseling and surveillance: Secondary | ICD-10-CM | POA: Insufficient documentation

## 2015-01-24 DIAGNOSIS — O289 Unspecified abnormal findings on antenatal screening of mother: Secondary | ICD-10-CM

## 2015-01-24 DIAGNOSIS — O280 Abnormal hematological finding on antenatal screening of mother: Secondary | ICD-10-CM

## 2015-01-24 DIAGNOSIS — O24913 Unspecified diabetes mellitus in pregnancy, third trimester: Secondary | ICD-10-CM | POA: Insufficient documentation

## 2015-01-24 LAB — POCT URINALYSIS DIP (DEVICE)
BILIRUBIN URINE: NEGATIVE
GLUCOSE, UA: 100 mg/dL — AB
Hgb urine dipstick: NEGATIVE
KETONES UR: NEGATIVE mg/dL
Nitrite: NEGATIVE
Protein, ur: NEGATIVE mg/dL
SPECIFIC GRAVITY, URINE: 1.015 (ref 1.005–1.030)
Urobilinogen, UA: 0.2 mg/dL (ref 0.0–1.0)
pH: 6 (ref 5.0–8.0)

## 2015-01-24 MED ORDER — GLUCOSE BLOOD VI STRP: ORAL_STRIP | Status: DC

## 2015-01-24 MED ORDER — ACCU-CHEK FASTCLIX LANCETS MISC: 1.0000 | Freq: Four times a day (QID) | Status: DC

## 2015-01-24 MED ORDER — ACCU-CHEK NANO SMARTVIEW W/DEVICE KIT: 1.0000 | PACK | Freq: Four times a day (QID) | Status: DC

## 2015-01-24 NOTE — Progress Notes (Signed)
  Patient was seen on 01/24/15 for Gestational Diabetes self-management . [place] previously met with Nutritionist. However, this morning she had cereal, fruit and milk for breakfast (no protein). We reviewed basic nutritional guidelines.  The following learning objectives were met by the patient :   States the definition of Gestational Diabetes  States when to check blood glucose levels  Demonstrates proper blood glucose monitoring techniques  States the effect of stress and exercise on blood glucose levels  States the importance of limiting caffeine and abstaining from alcohol and smoking  Plan:  Decrease carbohydrate intake and have protein with all meals and snacks Consider  increasing your activity level by walking daily as tolerated Begin checking BG before breakfast and 2 hours after first bit of breakfast, lunch and dinner after  as directed by MD  Take medication  as directed by MD  Order sent to [facility] for Accu-Chek Nano and testing supplies  Patient instructed to monitor glucose levels: FBS: 60 - <90 2 hour: <120  Patient will be seen for follow-up as needed.

## 2015-01-24 NOTE — Addendum Note (Signed)
Addended by: Rosendo GrosHALPIN, Cailynn Bodnar L on: 01/24/2015 12:04 PM   Modules accepted: Orders

## 2015-01-24 NOTE — Progress Notes (Signed)
Glucose reading after breakfast 118mg /dl at time of Diabetes consult

## 2015-01-24 NOTE — Progress Notes (Signed)
Patient is 34 y.o. G1P0 [redacted]w[redacted]d  +FM, denies LOF, VB, contractions, vaginal discharge.  Overall feeling well. GDM: reports does not have meter yet, to meet with diabetic educator today.  Previously met with nutrition.

## 2015-01-31 ENCOUNTER — Ambulatory Visit (INDEPENDENT_AMBULATORY_CARE_PROVIDER_SITE_OTHER): Payer: PRIVATE HEALTH INSURANCE | Admitting: Obstetrics and Gynecology

## 2015-01-31 VITALS — BP 129/71 | HR 92 | Temp 97.8°F | Wt 317.7 lb

## 2015-01-31 DIAGNOSIS — O24913 Unspecified diabetes mellitus in pregnancy, third trimester: Secondary | ICD-10-CM

## 2015-01-31 DIAGNOSIS — O24410 Gestational diabetes mellitus in pregnancy, diet controlled: Secondary | ICD-10-CM

## 2015-01-31 LAB — POCT URINALYSIS DIP (DEVICE)
Bilirubin Urine: NEGATIVE
Glucose, UA: NEGATIVE mg/dL
HGB URINE DIPSTICK: NEGATIVE
Ketones, ur: NEGATIVE mg/dL
NITRITE: NEGATIVE
Protein, ur: NEGATIVE mg/dL
Specific Gravity, Urine: 1.015 (ref 1.005–1.030)
UROBILINOGEN UA: 0.2 mg/dL (ref 0.0–1.0)
pH: 6 (ref 5.0–8.0)

## 2015-01-31 NOTE — Progress Notes (Signed)
34 y.o. G1P0 at 4962w5d here for routine visit. Doing well today. No concerns.  1. GDM, diet controlled. Left blood glucose log at home. Patient reports blood sugars between 70s-80s first thing in the morning, after meals mostly 110-120, few > 120. Highest is 146. May need to start medications, stressed the importance of bring blood glucose log to clinic next week for review. Continue diet and exercise. Growth ultrasound at 1350w1d EFW 1585 gm (42%ile). Repeat growth scan scheduled for 3/31. 2. Routine PNC. Vaginal swabs next week. FM/PTL precautions reviewed. RTC 1 week.

## 2015-01-31 NOTE — Progress Notes (Signed)
C/o occasional pelvic discomfort and difficulty sleeping.  Edema at ankles.

## 2015-02-03 ENCOUNTER — Ambulatory Visit (HOSPITAL_COMMUNITY)
Admission: RE | Admit: 2015-02-03 | Discharge: 2015-02-03 | Disposition: A | Discharge status: Home / Self Care | Payer: PRIVATE HEALTH INSURANCE | Source: Ambulatory Visit | Attending: Family Medicine | Admitting: Family Medicine

## 2015-02-03 ENCOUNTER — Encounter (HOSPITAL_COMMUNITY): Payer: Self-pay

## 2015-02-03 DIAGNOSIS — O24410 Gestational diabetes mellitus in pregnancy, diet controlled: Secondary | ICD-10-CM | POA: Insufficient documentation

## 2015-02-03 DIAGNOSIS — O99213 Obesity complicating pregnancy, third trimester: Secondary | ICD-10-CM | POA: Diagnosis not present

## 2015-02-03 DIAGNOSIS — O99210 Obesity complicating pregnancy, unspecified trimester: Secondary | ICD-10-CM

## 2015-02-03 DIAGNOSIS — O352XX0 Maternal care for (suspected) hereditary disease in fetus, not applicable or unspecified: Secondary | ICD-10-CM

## 2015-02-03 DIAGNOSIS — Z3A35 35 weeks gestation of pregnancy: Secondary | ICD-10-CM | POA: Insufficient documentation

## 2015-02-07 ENCOUNTER — Encounter: Payer: Self-pay | Admitting: Family Medicine

## 2015-02-07 ENCOUNTER — Ambulatory Visit (INDEPENDENT_AMBULATORY_CARE_PROVIDER_SITE_OTHER): Payer: PRIVATE HEALTH INSURANCE | Admitting: Family Medicine

## 2015-02-07 VITALS — BP 114/73 | HR 94 | Temp 97.5°F | Wt 320.0 lb

## 2015-02-07 DIAGNOSIS — O24913 Unspecified diabetes mellitus in pregnancy, third trimester: Secondary | ICD-10-CM

## 2015-02-07 LAB — POCT URINALYSIS DIP (DEVICE)
Bilirubin Urine: NEGATIVE
GLUCOSE, UA: NEGATIVE mg/dL
Hgb urine dipstick: NEGATIVE
Ketones, ur: NEGATIVE mg/dL
Nitrite: NEGATIVE
Protein, ur: NEGATIVE mg/dL
Specific Gravity, Urine: 1.015 (ref 1.005–1.030)
UROBILINOGEN UA: 0.2 mg/dL (ref 0.0–1.0)
pH: 6.5 (ref 5.0–8.0)

## 2015-02-07 MED ORDER — ACCU-CHEK FASTCLIX LANCETS MISC: 1.0000 | Freq: Four times a day (QID) | Status: DC

## 2015-02-07 NOTE — Progress Notes (Signed)
Small leukocytes in urine.  

## 2015-02-07 NOTE — Progress Notes (Signed)
Reports no book today BS pp reports they are elevated to 130-140-150 (highest) Add exercise FBS reportedly < 90 Cultures next week U/S growth last week--5 lbs 3 oz 42%, AFI 9 cm, vtx

## 2015-02-07 NOTE — Progress Notes (Signed)
Needs rx for new insulin pen

## 2015-02-07 NOTE — Patient Instructions (Signed)
Gestational Diabetes Mellitus Gestational diabetes mellitus, often simply referred to as gestational diabetes, is a type of diabetes that some women develop during pregnancy. In gestational diabetes, the pancreas does not make enough insulin (a hormone), the cells are less responsive to the insulin that is made (insulin resistance), or both.Normally, insulin moves sugars from food into the tissue cells. The tissue cells use the sugars for energy. The lack of insulin or the lack of normal response to insulin causes excess sugars to build up in the blood instead of going into the tissue cells. As a result, high blood sugar (hyperglycemia) develops. The effect of high sugar (glucose) levels can cause many problems.  RISK FACTORS You have an increased chance of developing gestational diabetes if you have a family history of diabetes and also have one or more of the following risk factors:  A body mass index over 30 (obesity).  A previous pregnancy with gestational diabetes.  An older age at the time of pregnancy. If blood glucose levels are kept in the normal range during pregnancy, women can have a healthy pregnancy. If your blood glucose levels are not well controlled, there may be risks to you, your unborn baby (fetus), your labor and delivery, or your newborn baby.  SYMPTOMS  If symptoms are experienced, they are much like symptoms you would normally expect during pregnancy. The symptoms of gestational diabetes include:   Increased thirst (polydipsia).  Increased urination (polyuria).  Increased urination during the night (nocturia).  Weight loss. This weight loss may be rapid.  Frequent, recurring infections.  Tiredness (fatigue).  Weakness.  Vision changes, such as blurred vision.  Fruity smell to your breath.  Abdominal pain. DIAGNOSIS Diabetes is diagnosed when blood glucose levels are increased. Your blood glucose level may be checked by one or more of the following blood  tests:  A fasting blood glucose test. You will not be allowed to eat for at least 8 hours before a blood sample is taken.  A random blood glucose test. Your blood glucose is checked at any time of the day regardless of when you ate.  A hemoglobin A1c blood glucose test. A hemoglobin A1c test provides information about blood glucose control over the previous 3 months.  An oral glucose tolerance test (OGTT). Your blood glucose is measured after you have not eaten (fasted) for 1-3 hours and then after you drink a glucose-containing beverage. Since the hormones that cause insulin resistance are highest at about 24-28 weeks of a pregnancy, an OGTT is usually performed during that time. If you have risk factors for gestational diabetes, your health care provider may test you for gestational diabetes earlier than 24 weeks of pregnancy. TREATMENT   You will need to take diabetes medicine or insulin daily to keep blood glucose levels in the desired range.  You will need to match insulin dosing with exercise and healthy food choices. The treatment goal is to maintain the before-meal (preprandial), bedtime, and overnight blood glucose level at 60-99 mg/dL during pregnancy. The treatment goal is to further maintain peak after-meal blood sugar (postprandial glucose) level at 100-140 mg/dL. HOME CARE INSTRUCTIONS   Have your hemoglobin A1c level checked twice a year.  Perform daily blood glucose monitoring as directed by your health care provider. It is common to perform frequent blood glucose monitoring.  Monitor urine ketones when you are ill and as directed by your health care provider.  Take your diabetes medicine and insulin as directed by your health care provider   to maintain your blood glucose level in the desired range.  Never run out of diabetes medicine or insulin. It is needed every day.  Adjust insulin based on your intake of carbohydrates. Carbohydrates can raise blood glucose levels but  need to be included in your diet. Carbohydrates provide vitamins, minerals, and fiber which are an essential part of a healthy diet. Carbohydrates are found in fruits, vegetables, whole grains, dairy products, legumes, and foods containing added sugars.  Eat healthy foods. Alternate 3 meals with 3 snacks.  Maintain a healthy weight gain. The usual total expected weight gain varies according to your prepregnancy body mass index (BMI).  Carry a medical alert card or wear your medical alert jewelry.  Carry a 15-gram carbohydrate snack with you at all times to treat low blood glucose (hypoglycemia). Some examples of 15-gram carbohydrate snacks include:  Glucose tablets, 3 or 4.  Glucose gel, 15-gram tube.  Raisins, 2 tablespoons (24 g).  Jelly beans, 6.  Animal crackers, 8.  Fruit juice, regular soda, or low-fat milk, 4 ounces (120 mL).  Gummy treats, 9.  Recognize hypoglycemia. Hypoglycemia during pregnancy occurs with blood glucose levels of 60 mg/dL and below. The risk for hypoglycemia increases when fasting or skipping meals, during or after intense exercise, and during sleep. Hypoglycemia symptoms can include:  Tremors or shakes.  Decreased ability to concentrate.  Sweating.  Increased heart rate.  Headache.  Dry mouth.  Hunger.  Irritability.  Anxiety.  Restless sleep.  Altered speech or coordination.  Confusion.  Treat hypoglycemia promptly. If you are alert and able to safely swallow, follow the 15:15 rule:  Take 15-20 grams of rapid-acting glucose or carbohydrate. Rapid-acting options include glucose gel, glucose tablets, or 4 ounces (120 mL) of fruit juice, regular soda, or low-fat milk.  Check your blood glucose level 15 minutes after taking the glucose.  Take 15-20 grams more of glucose if the repeat blood glucose level is still 70 mg/dL or below.  Eat a meal or snack within 1 hour once blood glucose levels return to normal.  Be alert to polyuria  (excess urination) and polydipsia (excess thirst) which are early signs of hyperglycemia. An early awareness of hyperglycemia allows for prompt treatment. Treat hyperglycemia as directed by your health care provider.  Engage in at least 30 minutes of physical activity a day or as directed by your health care provider. Ten minutes of physical activity timed 30 minutes after each meal is encouraged to control postprandial blood glucose levels.  Adjust your insulin dosing and food intake as needed if you start a new exercise or sport.  Follow your sick-day plan at any time you are unable to eat or drink as usual.  Avoid tobacco and alcohol use.  Keep all follow-up visits as directed by your health care provider.  Follow the advice of your health care provider regarding your prenatal and post-delivery (postpartum) appointments, meal planning, exercise, medicines, vitamins, blood tests, other medical tests, and physical activities.  Perform daily skin and foot care. Examine your skin and feet daily for cuts, bruises, redness, nail problems, bleeding, blisters, or sores.  Brush your teeth and gums at least twice a day and floss at least once a day. Follow up with your dentist regularly.  Schedule an eye exam during the first trimester of your pregnancy or as directed by your health care provider.  Share your diabetes management plan with your workplace or school.  Stay up-to-date with immunizations.  Learn to manage stress.    Obtain ongoing diabetes education and support as needed.  Learn about and consider breastfeeding your baby.  You should have your blood sugar level checked 6-12 weeks after delivery. This is done with an oral glucose tolerance test (OGTT). SEEK MEDICAL CARE IF:   You are unable to eat food or drink fluids for more than 6 hours.  You have nausea and vomiting for more than 6 hours.  You have a blood glucose level of 200 mg/dL and you have ketones in your  urine.  There is a change in mental status.  You develop vision problems.  You have a persistent headache.  You have upper abdominal pain or discomfort.  You develop an additional serious illness.  You have diarrhea for more than 6 hours.  You have been sick or have had a fever for a couple of days and are not getting better. SEEK IMMEDIATE MEDICAL CARE IF:   You have difficulty breathing.  You no longer feel the baby moving.  You are bleeding or have discharge from your vagina.  You start having premature contractions or labor. MAKE SURE YOU:  Understand these instructions.  Will watch your condition.  Will get help right away if you are not doing well or get worse. Document Released: 01/28/2001 Document Revised: 03/08/2014 Document Reviewed: 05/20/2012 [employer] Patient Information 2015 [employer]. This information is not intended to replace advice given to you by your health care provider. Make sure you discuss any questions you have with your health care provider.  Breastfeeding Deciding to breastfeed is one of the best choices you can make for you and your baby. A change in hormones during pregnancy causes your breast tissue to grow and increases the number and size of your milk ducts. These hormones also allow proteins, sugars, and fats from your blood supply to make breast milk in your milk-producing glands. Hormones prevent breast milk from being released before your baby is born as well as prompt milk flow after birth. Once breastfeeding has begun, thoughts of your baby, as well as his or her sucking or crying, can stimulate the release of milk from your milk-producing glands.  BENEFITS OF BREASTFEEDING For Your Baby  Your first milk (colostrum) helps your baby's digestive system function better.   There are antibodies in your milk that help your baby fight off infections.   Your baby has a lower incidence of asthma, allergies, and sudden infant death  syndrome.   The nutrients in breast milk are better for your baby than infant formulas and are designed uniquely for your baby's needs.   Breast milk improves your baby's brain development.   Your baby is less likely to develop other conditions, such as childhood obesity, asthma, or type 2 diabetes mellitus.  For You   Breastfeeding helps to create a very special bond between you and your baby.   Breastfeeding is convenient. Breast milk is always available at the correct temperature and costs nothing.   Breastfeeding helps to burn calories and helps you lose the weight gained during pregnancy.   Breastfeeding makes your uterus contract to its prepregnancy size faster and slows bleeding (lochia) after you give birth.   Breastfeeding helps to lower your risk of developing type 2 diabetes mellitus, osteoporosis, and breast or ovarian cancer later in life. SIGNS THAT YOUR BABY IS HUNGRY Early Signs of Hunger  Increased alertness or activity.  Stretching.  Movement of the head from side to side.  Movement of the head and opening of the   mouth when the corner of the mouth or cheek is stroked (rooting).  Increased sucking sounds, smacking lips, cooing, sighing, or squeaking.  Hand-to-mouth movements.  Increased sucking of fingers or hands. Late Signs of Hunger  Fussing.  Intermittent crying. Extreme Signs of Hunger Signs of extreme hunger will require calming and consoling before your baby will be able to breastfeed successfully. Do not wait for the following signs of extreme hunger to occur before you initiate breastfeeding:   Restlessness.  A loud, strong cry.   Screaming. BREASTFEEDING BASICS Breastfeeding Initiation  Find a comfortable place to sit or lie down, with your neck and back well supported.  Place a pillow or rolled up blanket under your baby to bring him or her to the level of your breast (if you are seated). Nursing pillows are specially designed  to help support your arms and your baby while you breastfeed.  Make sure that your baby's abdomen is facing your abdomen.   Gently massage your breast. With your fingertips, massage from your chest wall toward your nipple in a circular motion. This encourages milk flow. You may need to continue this action during the feeding if your milk flows slowly.  Support your breast with 4 fingers underneath and your thumb above your nipple. Make sure your fingers are well away from your nipple and your baby's mouth.   Stroke your baby's lips gently with your finger or nipple.   When your baby's mouth is open wide enough, quickly bring your baby to your breast, placing your entire nipple and as much of the colored area around your nipple (areola) as possible into your baby's mouth.   More areola should be visible above your baby's upper lip than below the lower lip.   Your baby's tongue should be between his or her lower gum and your breast.   Ensure that your baby's mouth is correctly positioned around your nipple (latched). Your baby's lips should create a seal on your breast and be turned out (everted).  It is common for your baby to suck about 2-3 minutes in order to start the flow of breast milk. Latching Teaching your baby how to latch on to your breast properly is very important. An improper latch can cause nipple pain and decreased milk supply for you and poor weight gain in your baby. Also, if your baby is not latched onto your nipple properly, he or she may swallow some air during feeding. This can make your baby fussy. Burping your baby when you switch breasts during the feeding can help to get rid of the air. However, teaching your baby to latch on properly is still the best way to prevent fussiness from swallowing air while breastfeeding. Signs that your baby has successfully latched on to your nipple:    Silent tugging or silent sucking, without causing you pain.   Swallowing  heard between every 3-4 sucks.    Muscle movement above and in front of his or her ears while sucking.  Signs that your baby has not successfully latched on to nipple:   Sucking sounds or smacking sounds from your baby while breastfeeding.  Nipple pain. If you think your baby has not latched on correctly, slip your finger into the corner of your baby's mouth to break the suction and place it between your baby's gums. Attempt breastfeeding initiation again. Signs of Successful Breastfeeding Signs from your baby:   A gradual decrease in the number of sucks or complete cessation of sucking.     Falling asleep.   Relaxation of his or her body.   Retention of a small amount of milk in his or her mouth.   Letting go of your breast by himself or herself. Signs from you:  Breasts that have increased in firmness, weight, and size 1-3 hours after feeding.   Breasts that are softer immediately after breastfeeding.  Increased milk volume, as well as a change in milk consistency and color by the fifth day of breastfeeding.   Nipples that are not sore, cracked, or bleeding. Signs That Your Baby is Getting Enough Milk  Wetting at least 3 diapers in a 24-hour period. The urine should be clear and pale yellow by age 5 days.  At least 3 stools in a 24-hour period by age 5 days. The stool should be soft and yellow.  At least 3 stools in a 24-hour period by age 7 days. The stool should be seedy and yellow.  No loss of weight greater than 10% of birth weight during the first 3 days of age.  Average weight gain of 4-7 ounces (113-198 g) per week after age 4 days.  Consistent daily weight gain by age 5 days, without weight loss after the age of 2 weeks. After a feeding, your baby may spit up a small amount. This is common. BREASTFEEDING FREQUENCY AND DURATION Frequent feeding will help you make more milk and can prevent sore nipples and breast engorgement. Breastfeed when you feel the  need to reduce the fullness of your breasts or when your baby shows signs of hunger. This is called "breastfeeding on demand." Avoid introducing a pacifier to your baby while you are working to establish breastfeeding (the first 4-6 weeks after your baby is born). After this time you may choose to use a pacifier. Research has shown that pacifier use during the first year of a baby's life decreases the risk of sudden infant death syndrome (SIDS). Allow your baby to feed on each breast as long as he or she wants. Breastfeed until your baby is finished feeding. When your baby unlatches or falls asleep while feeding from the first breast, offer the second breast. Because newborns are often sleepy in the first few weeks of life, you may need to awaken your baby to get him or her to feed. Breastfeeding times will vary from baby to baby. However, the following rules can serve as a guide to help you ensure that your baby is properly fed:  Newborns (babies 4 weeks of age or younger) may breastfeed every 1-3 hours.  Newborns should not go longer than 3 hours during the day or 5 hours during the night without breastfeeding.  You should breastfeed your baby a minimum of 8 times in a 24-hour period until you begin to introduce solid foods to your baby at around 6 months of age. BREAST MILK PUMPING Pumping and storing breast milk allows you to ensure that your baby is exclusively fed your breast milk, even at times when you are unable to breastfeed. This is especially important if you are going back to work while you are still breastfeeding or when you are not able to be present during feedings. Your lactation consultant can give you guidelines on how long it is safe to store breast milk.  A breast pump is a machine that allows you to pump milk from your breast into a sterile bottle. The pumped breast milk can then be stored in a refrigerator or freezer. Some breast pumps are operated by   hand, while others use  electricity. Ask your lactation consultant which type will work best for you. Breast pumps can be purchased, but some hospitals and breastfeeding support groups lease breast pumps on a monthly basis. A lactation consultant can teach you how to hand express breast milk, if you prefer not to use a pump.  CARING FOR YOUR BREASTS WHILE YOU BREASTFEED Nipples can become dry, cracked, and sore while breastfeeding. The following recommendations can help keep your breasts moisturized and healthy:  Avoid using soap on your nipples.   Wear a supportive bra. Although not required, special nursing bras and tank tops are designed to allow access to your breasts for breastfeeding without taking off your entire bra or top. Avoid wearing underwire-style bras or extremely tight bras.  Air dry your nipples for 3-4minutes after each feeding.   Use only cotton bra pads to absorb leaked breast milk. Leaking of breast milk between feedings is normal.   Use lanolin on your nipples after breastfeeding. Lanolin helps to maintain your skin's normal moisture barrier. If you use pure lanolin, you do not need to wash it off before feeding your baby again. Pure lanolin is not toxic to your baby. You may also hand express a few drops of breast milk and gently massage that milk into your nipples and allow the milk to air dry. In the first few weeks after giving birth, some women experience extremely full breasts (engorgement). Engorgement can make your breasts feel heavy, warm, and tender to the touch. Engorgement peaks within 3-5 days after you give birth. The following recommendations can help ease engorgement:  Completely empty your breasts while breastfeeding or pumping. You may want to start by applying warm, moist heat (in the shower or with warm water-soaked hand towels) just before feeding or pumping. This increases circulation and helps the milk flow. If your baby does not completely empty your breasts while  breastfeeding, pump any extra milk after he or she is finished.  Wear a snug bra (nursing or regular) or tank top for 1-2 days to signal your body to slightly decrease milk production.  Apply ice packs to your breasts, unless this is too uncomfortable for you.  Make sure that your baby is latched on and positioned properly while breastfeeding. If engorgement persists after 48 hours of following these recommendations, contact your health care provider or a lactation consultant. OVERALL HEALTH CARE RECOMMENDATIONS WHILE BREASTFEEDING  Eat healthy foods. Alternate between meals and snacks, eating 3 of each per day. Because what you eat affects your breast milk, some of the foods may make your baby more irritable than usual. Avoid eating these foods if you are sure that they are negatively affecting your baby.  Drink milk, fruit juice, and water to satisfy your thirst (about 10 glasses a day).   Rest often, relax, and continue to take your prenatal vitamins to prevent fatigue, stress, and anemia.  Continue breast self-awareness checks.  Avoid chewing and smoking tobacco.  Avoid alcohol and drug use. Some medicines that may be harmful to your baby can pass through breast milk. It is important to ask your health care provider before taking any medicine, including all over-the-counter and prescription medicine as well as vitamin and herbal supplements. It is possible to become pregnant while breastfeeding. If birth control is desired, ask your health care provider about options that will be safe for your baby. SEEK MEDICAL CARE IF:   You feel like you want to stop breastfeeding or have become   frustrated with breastfeeding.  You have painful breasts or nipples.  Your nipples are cracked or bleeding.  Your breasts are red, tender, or warm.  You have a swollen area on either breast.  You have a fever or chills.  You have nausea or vomiting.  You have drainage other than breast milk from  your nipples.  Your breasts do not become full before feedings by the fifth day after you give birth.  You feel sad and depressed.  Your baby is too sleepy to eat well.  Your baby is having trouble sleeping.   Your baby is wetting less than 3 diapers in a 24-hour period.  Your baby has less than 3 stools in a 24-hour period.  Your baby's skin or the white part of his or her eyes becomes yellow.   Your baby is not gaining weight by 5 days of age. SEEK IMMEDIATE MEDICAL CARE IF:   Your baby is overly tired (lethargic) and does not want to wake up and feed.  Your baby develops an unexplained fever. Document Released: 10/22/2005 Document Revised: 10/27/2013 Document Reviewed: 04/15/2013 [employer] Patient Information 2015 [employer]. This information is not intended to replace advice given to you by your health care provider. Make sure you discuss any questions you have with your health care provider.  

## 2015-02-08 ENCOUNTER — Encounter: Payer: Self-pay | Admitting: General Practice

## 2015-02-14 ENCOUNTER — Ambulatory Visit (INDEPENDENT_AMBULATORY_CARE_PROVIDER_SITE_OTHER): Payer: PRIVATE HEALTH INSURANCE | Admitting: Obstetrics & Gynecology

## 2015-02-14 ENCOUNTER — Encounter: Payer: Self-pay | Admitting: Obstetrics & Gynecology

## 2015-02-14 VITALS — BP 133/80 | HR 80 | Temp 97.7°F | Wt 319.8 lb

## 2015-02-14 DIAGNOSIS — O219 Vomiting of pregnancy, unspecified: Secondary | ICD-10-CM

## 2015-02-14 DIAGNOSIS — O24913 Unspecified diabetes mellitus in pregnancy, third trimester: Secondary | ICD-10-CM

## 2015-02-14 DIAGNOSIS — Z23 Encounter for immunization: Secondary | ICD-10-CM

## 2015-02-14 LAB — POCT URINALYSIS DIP (DEVICE)
Bilirubin Urine: NEGATIVE
Glucose, UA: NEGATIVE mg/dL
Hgb urine dipstick: NEGATIVE
Ketones, ur: NEGATIVE mg/dL
Leukocytes, UA: NEGATIVE
Nitrite: NEGATIVE
Protein, ur: NEGATIVE mg/dL
Specific Gravity, Urine: 1.015 (ref 1.005–1.030)
Urobilinogen, UA: 0.2 mg/dL (ref 0.0–1.0)
pH: 6 (ref 5.0–8.0)

## 2015-02-14 LAB — OB RESULTS CONSOLE GBS: GBS: NEGATIVE

## 2015-02-14 LAB — OB RESULTS CONSOLE GC/CHLAMYDIA
CHLAMYDIA, DNA PROBE: NEGATIVE
GC PROBE AMP, GENITAL: NEGATIVE

## 2015-02-14 MED ORDER — PROMETHAZINE HCL 25 MG PO TABS: 25.0000 mg | ORAL_TABLET | Freq: Four times a day (QID) | ORAL | Status: DC | PRN

## 2015-02-14 NOTE — Patient Instructions (Signed)
Return to clinic for any obstetric concerns or go to [facility] for evaluation  

## 2015-02-14 NOTE — Progress Notes (Signed)
Patient reports that she "does not feel well today"  . Allergies bothering her, nauseated, stomach hurting.   This is off and on.  Recommended OTC remedies, Phenergan prescribed as needed. Blood sugars are mostly within range.  EFW 42%, cephalic at 35 week scan, repeat at 39 weeks if still pregnant. Pelvic cultures today. Tdap next visit as per patient preference. No other complaints or concerns.  Labor and fetal movement precautions reviewed.

## 2015-02-15 LAB — GC/CHLAMYDIA PROBE AMP
CT Probe RNA: NEGATIVE
GC PROBE AMP APTIMA: NEGATIVE

## 2015-02-16 LAB — CULTURE, BETA STREP (GROUP B ONLY)

## 2015-02-21 ENCOUNTER — Ambulatory Visit (INDEPENDENT_AMBULATORY_CARE_PROVIDER_SITE_OTHER): Payer: PRIVATE HEALTH INSURANCE | Admitting: Obstetrics & Gynecology

## 2015-02-21 VITALS — BP 139/81 | HR 84 | Temp 97.2°F | Wt 323.3 lb

## 2015-02-21 DIAGNOSIS — Z23 Encounter for immunization: Secondary | ICD-10-CM | POA: Diagnosis not present

## 2015-02-21 DIAGNOSIS — O24913 Unspecified diabetes mellitus in pregnancy, third trimester: Secondary | ICD-10-CM

## 2015-02-21 LAB — POCT URINALYSIS DIP (DEVICE)
BILIRUBIN URINE: NEGATIVE
Glucose, UA: NEGATIVE mg/dL
Hgb urine dipstick: NEGATIVE
KETONES UR: NEGATIVE mg/dL
Nitrite: NEGATIVE
Protein, ur: NEGATIVE mg/dL
SPECIFIC GRAVITY, URINE: 1.015 (ref 1.005–1.030)
Urobilinogen, UA: 0.2 mg/dL (ref 0.0–1.0)
pH: 6 (ref 5.0–8.0)

## 2015-02-21 MED ORDER — TETANUS-DIPHTH-ACELL PERTUSSIS 5-2.5-18.5 LF-MCG/0.5 IM SUSP
0.5000 mL | Freq: Once | INTRAMUSCULAR | Status: AC
  Administered 2015-02-21: 0.5 mL via INTRAMUSCULAR

## 2015-02-21 NOTE — Addendum Note (Signed)
Addended by: Gerome ApleyZEYFANG, Inger Wiest L on: 02/21/2015 11:29 AM   Modules accepted: Orders

## 2015-02-21 NOTE — Progress Notes (Signed)
Fastings--all nml.  Pp are nml except for pp lunch whidh are in 120s.  Continue diet.

## 2015-02-21 NOTE — Patient Instructions (Signed)

## 2015-02-21 NOTE — Progress Notes (Signed)
U/S 03/03/15 @ 145p with Radiology.

## 2015-02-24 ENCOUNTER — Ambulatory Visit (HOSPITAL_COMMUNITY): Payer: PRIVATE HEALTH INSURANCE

## 2015-02-28 ENCOUNTER — Encounter: Payer: Self-pay | Admitting: Obstetrics and Gynecology

## 2015-02-28 ENCOUNTER — Encounter (HOSPITAL_COMMUNITY): Payer: Self-pay

## 2015-02-28 ENCOUNTER — Inpatient Hospital Stay (HOSPITAL_COMMUNITY)
Admission: AD | Admit: 2015-02-28 | Discharge: 2015-03-04 | DRG: 775 | Disposition: A | Discharge status: Home / Self Care | Payer: PRIVATE HEALTH INSURANCE | Source: Ambulatory Visit | Attending: Family Medicine | Admitting: Family Medicine

## 2015-02-28 ENCOUNTER — Telehealth (HOSPITAL_COMMUNITY): Payer: Self-pay | Admitting: *Deleted

## 2015-02-28 ENCOUNTER — Ambulatory Visit (INDEPENDENT_AMBULATORY_CARE_PROVIDER_SITE_OTHER): Payer: PRIVATE HEALTH INSURANCE | Admitting: Obstetrics and Gynecology

## 2015-02-28 VITALS — BP 143/85 | HR 87 | Temp 97.8°F | Wt 327.9 lb

## 2015-02-28 DIAGNOSIS — O99213 Obesity complicating pregnancy, third trimester: Secondary | ICD-10-CM

## 2015-02-28 DIAGNOSIS — Z3A38 38 weeks gestation of pregnancy: Secondary | ICD-10-CM | POA: Diagnosis present

## 2015-02-28 DIAGNOSIS — O133 Gestational [pregnancy-induced] hypertension without significant proteinuria, third trimester: Secondary | ICD-10-CM | POA: Diagnosis present

## 2015-02-28 DIAGNOSIS — IMO0001 Reserved for inherently not codable concepts without codable children: Secondary | ICD-10-CM

## 2015-02-28 DIAGNOSIS — Z3A39 39 weeks gestation of pregnancy: Secondary | ICD-10-CM | POA: Diagnosis not present

## 2015-02-28 DIAGNOSIS — O99214 Obesity complicating childbirth: Secondary | ICD-10-CM | POA: Diagnosis present

## 2015-02-28 DIAGNOSIS — O24913 Unspecified diabetes mellitus in pregnancy, third trimester: Secondary | ICD-10-CM

## 2015-02-28 DIAGNOSIS — Z6841 Body Mass Index (BMI) 40.0 and over, adult: Secondary | ICD-10-CM | POA: Diagnosis not present

## 2015-02-28 DIAGNOSIS — O24420 Gestational diabetes mellitus in childbirth, diet controlled: Secondary | ICD-10-CM | POA: Diagnosis present

## 2015-02-28 DIAGNOSIS — Z8759 Personal history of other complications of pregnancy, childbirth and the puerperium: Secondary | ICD-10-CM | POA: Diagnosis present

## 2015-02-28 DIAGNOSIS — O24410 Gestational diabetes mellitus in pregnancy, diet controlled: Secondary | ICD-10-CM

## 2015-02-28 HISTORY — DX: Personal history of other complications of pregnancy, childbirth and the puerperium: Z87.59

## 2015-02-28 LAB — URINALYSIS, ROUTINE W REFLEX MICROSCOPIC
Bilirubin Urine: NEGATIVE
Glucose, UA: NEGATIVE mg/dL
Hgb urine dipstick: NEGATIVE
Ketones, ur: NEGATIVE mg/dL
Leukocytes, UA: NEGATIVE
Nitrite: NEGATIVE
Protein, ur: NEGATIVE mg/dL
Specific Gravity, Urine: 1.01 (ref 1.005–1.030)
Urobilinogen, UA: 0.2 mg/dL (ref 0.0–1.0)
pH: 5.5 (ref 5.0–8.0)

## 2015-02-28 LAB — CBC
HEMATOCRIT: 30.3 % — AB (ref 36.0–46.0)
Hemoglobin: 10.3 g/dL — ABNORMAL LOW (ref 12.0–15.0)
MCH: 27.8 pg (ref 26.0–34.0)
MCHC: 34 g/dL (ref 30.0–36.0)
MCV: 81.9 fL (ref 78.0–100.0)
PLATELETS: 273 10*3/uL (ref 150–400)
RBC: 3.7 MIL/uL — ABNORMAL LOW (ref 3.87–5.11)
RDW: 16.5 % — ABNORMAL HIGH (ref 11.5–15.5)
WBC: 12.2 10*3/uL — ABNORMAL HIGH (ref 4.0–10.5)

## 2015-02-28 LAB — COMPREHENSIVE METABOLIC PANEL
ALBUMIN: 2.5 g/dL — AB (ref 3.5–5.2)
ALT: 12 U/L (ref 0–35)
AST: 20 U/L (ref 0–37)
Alkaline Phosphatase: 147 U/L — ABNORMAL HIGH (ref 39–117)
Anion gap: 6 (ref 5–15)
BILIRUBIN TOTAL: 0.2 mg/dL — AB (ref 0.3–1.2)
BUN: 8 mg/dL (ref 6–23)
CALCIUM: 8.8 mg/dL (ref 8.4–10.5)
CHLORIDE: 109 mmol/L (ref 96–112)
CO2: 21 mmol/L (ref 19–32)
Creatinine, Ser: 1.09 mg/dL (ref 0.50–1.10)
GFR, EST AFRICAN AMERICAN: 77 mL/min — AB (ref >90)
GFR, EST NON AFRICAN AMERICAN: 66 mL/min — AB (ref >90)
Glucose, Bld: 97 mg/dL (ref 70–99)
Potassium: 4.8 mmol/L (ref 3.5–5.1)
Sodium: 136 mmol/L (ref 135–145)
TOTAL PROTEIN: 5.9 g/dL — AB (ref 6.0–8.3)

## 2015-02-28 LAB — GLUCOSE, CAPILLARY
Glucose-Capillary: 78 mg/dL (ref 70–99)
Glucose-Capillary: 140 mg/dL — ABNORMAL HIGH (ref 70–99)
Glucose-Capillary: 109 mg/dL — ABNORMAL HIGH (ref 70–99)

## 2015-02-28 LAB — TYPE AND SCREEN
ABO/RH(D): O POS
Antibody Screen: NEGATIVE

## 2015-02-28 LAB — POCT URINALYSIS DIP (DEVICE)
Bilirubin Urine: NEGATIVE
Glucose, UA: NEGATIVE mg/dL
Hgb urine dipstick: NEGATIVE
Ketones, ur: NEGATIVE mg/dL
Nitrite: NEGATIVE
Protein, ur: NEGATIVE mg/dL
Specific Gravity, Urine: 1.01 (ref 1.005–1.030)
Urobilinogen, UA: 0.2 mg/dL (ref 0.0–1.0)
pH: 5 (ref 5.0–8.0)

## 2015-02-28 LAB — PROTEIN / CREATININE RATIO, URINE
Creatinine, Urine: 66 mg/dL
Protein Creatinine Ratio: 0.17 — ABNORMAL HIGH (ref 0.00–0.15)
Total Protein, Urine: 11 mg/dL

## 2015-02-28 LAB — ABO/RH: ABO/RH(D): O POS

## 2015-02-28 MED ORDER — LACTATED RINGERS IV SOLN
500.0000 mL | INTRAVENOUS | Status: DC | PRN
  Administered 2015-03-01: 1000 mL via INTRAVENOUS
  Administered 2015-03-01: 300 mL via INTRAVENOUS
  Administered 2015-03-01: 250 mL via INTRAVENOUS
  Administered 2015-03-02 (×2): 300 mL via INTRAVENOUS

## 2015-02-28 MED ORDER — ONDANSETRON HCL 4 MG/2ML IJ SOLN
4.0000 mg | Freq: Four times a day (QID) | INTRAMUSCULAR | Status: DC | PRN
  Administered 2015-03-01: 4 mg via INTRAVENOUS
  Filled 2015-02-28: qty 2

## 2015-02-28 MED ORDER — OXYTOCIN BOLUS FROM INFUSION
500.0000 mL | INTRAVENOUS | Status: DC
  Administered 2015-03-02: 500 mL via INTRAVENOUS

## 2015-02-28 MED ORDER — CITRIC ACID-SODIUM CITRATE 334-500 MG/5ML PO SOLN
30.0000 mL | ORAL | Status: DC | PRN
  Filled 2015-02-28: qty 15

## 2015-02-28 MED ORDER — MISOPROSTOL 25 MCG QUARTER TABLET
25.0000 ug | ORAL_TABLET | ORAL | Status: DC | PRN
  Administered 2015-02-28 (×3): 25 ug via VAGINAL
  Filled 2015-02-28 (×3): qty 0.25
  Filled 2015-02-28: qty 1
  Filled 2015-02-28 (×2): qty 0.25

## 2015-02-28 MED ORDER — LACTATED RINGERS IV SOLN
INTRAVENOUS | Status: DC
  Administered 2015-02-28 – 2015-03-02 (×7): via INTRAVENOUS

## 2015-02-28 MED ORDER — OXYCODONE-ACETAMINOPHEN 5-325 MG PO TABS: 1.0000 | ORAL_TABLET | ORAL | Status: DC | PRN

## 2015-02-28 MED ORDER — OXYTOCIN 40 UNITS IN LACTATED RINGERS INFUSION - SIMPLE MED: 62.5000 mL/h | INTRAVENOUS | Status: DC

## 2015-02-28 MED ORDER — TERBUTALINE SULFATE 1 MG/ML IJ SOLN: 0.2500 mg | Freq: Once | INTRAMUSCULAR | Status: AC | PRN

## 2015-02-28 MED ORDER — LIDOCAINE HCL (PF) 1 % IJ SOLN
30.0000 mL | INTRAMUSCULAR | Status: DC | PRN
  Filled 2015-02-28: qty 30

## 2015-02-28 MED ORDER — ACETAMINOPHEN 325 MG PO TABS: 650.0000 mg | ORAL_TABLET | ORAL | Status: DC | PRN

## 2015-02-28 MED ORDER — OXYCODONE-ACETAMINOPHEN 5-325 MG PO TABS: 2.0000 | ORAL_TABLET | ORAL | Status: DC | PRN

## 2015-02-28 NOTE — [facility] Note (Signed)
Urine in lab 

## 2015-02-28 NOTE — Progress Notes (Signed)
Chelsea Harper is a 34 y.o. G1P0 at 191w5d admitted for induction of labor due to Hypertension.  Subjective: Denies discomfort at this time.  Objective: BP 138/71 mmHg  Pulse 85  Temp(Src) 98.3 F (36.8 C) (Oral)  Resp 18  Ht 5' 6.5" (1.689 m)  Wt 148.326 kg (327 lb)  BMI 51.99 kg/m2  LMP 06/02/2014 (Approximate)    FHT:  FHR: 140 bpm, variability: moderate,  accelerations:  Present,  decelerations:  Absent UC:   Irregular/mild  SVE:   Dilation: Closed Effacement (%): Thick Station: Ballotable Exam by:: Rosemarie AxL Ross CNM student  Labs: Lab Results  Component Value Date   WBC 12.2* 02/28/2015   HGB 10.3* 02/28/2015   HCT 30.3* 02/28/2015   MCV 81.9 02/28/2015   PLT 273 02/28/2015   Results for orders placed or performed during the hospital encounter of 02/28/15 (from the past 24 hour(s))  Urinalysis, Routine w reflex microscopic     Status: None   Collection Time: 02/28/15 10:50 AM  Result Value Ref Range   Color, Urine YELLOW YELLOW   APPearance CLEAR CLEAR   Specific Gravity, Urine 1.010 1.005 - 1.030   pH 5.5 5.0 - 8.0   Glucose, UA NEGATIVE NEGATIVE mg/dL   Hgb urine dipstick NEGATIVE NEGATIVE   Bilirubin Urine NEGATIVE NEGATIVE   Ketones, ur NEGATIVE NEGATIVE mg/dL   Protein, ur NEGATIVE NEGATIVE mg/dL   Urobilinogen, UA 0.2 0.0 - 1.0 mg/dL   Nitrite NEGATIVE NEGATIVE   Leukocytes, UA NEGATIVE NEGATIVE  Protein / creatinine ratio, urine     Status: Abnormal   Collection Time: 02/28/15 10:50 AM  Result Value Ref Range   Creatinine, Urine 66.00 mg/dL   Total Protein, Urine 11 mg/dL   Protein Creatinine Ratio 0.17 (H) 0.00 - 0.15  CBC     Status: Abnormal   Collection Time: 02/28/15 12:35 PM  Result Value Ref Range   WBC 12.2 (H) 4.0 - 10.5 K/uL   RBC 3.70 (L) 3.87 - 5.11 MIL/uL   Hemoglobin 10.3 (L) 12.0 - 15.0 g/dL   HCT 16.130.3 (L) 09.636.0 - 04.546.0 %   MCV 81.9 78.0 - 100.0 fL   MCH 27.8 26.0 - 34.0 pg   MCHC 34.0 30.0 - 36.0 g/dL   RDW 40.916.5 (H) 81.111.5 - 91.415.5 %    Platelets 273 150 - 400 K/uL  Comprehensive metabolic panel     Status: Abnormal   Collection Time: 02/28/15 12:35 PM  Result Value Ref Range   Sodium 136 135 - 145 mmol/L   Potassium 4.8 3.5 - 5.1 mmol/L   Chloride 109 96 - 112 mmol/L   CO2 21 19 - 32 mmol/L   Glucose, Bld 97 70 - 99 mg/dL   BUN 8 6 - 23 mg/dL   Creatinine, Ser 7.821.09 0.50 - 1.10 mg/dL   Calcium 8.8 8.4 - 95.610.5 mg/dL   Total Protein 5.9 (L) 6.0 - 8.3 g/dL   Albumin 2.5 (L) 3.5 - 5.2 g/dL   AST 20 0 - 37 U/L   ALT 12 0 - 35 U/L   Alkaline Phosphatase 147 (H) 39 - 117 U/L   Total Bilirubin 0.2 (L) 0.3 - 1.2 mg/dL   GFR calc non Af Amer 66 (L) >90 mL/min   GFR calc Af Amer 77 (L) >90 mL/min   Anion gap 6 5 - 15  Type and screen     Status: None   Collection Time: 02/28/15 12:35 PM  Result Value Ref Range   ABO/RH(D)  O POS    Antibody Screen NEG    Sample Expiration 03/03/2015   ABO/Rh     Status: None   Collection Time: 02/28/15 12:35 PM  Result Value Ref Range   ABO/RH(D) O POS   Glucose, capillary     Status: None   Collection Time: 02/28/15  2:43 PM  Result Value Ref Range   Glucose-Capillary 78 70 - 99 mg/dL  Glucose, capillary     Status: Abnormal   Collection Time: 02/28/15  6:55 PM  Result Value Ref Range   Glucose-Capillary 140 (H) 70 - 99 mg/dL   Comment 1 Notify RN      Assessment / Plan: Cervical ripening prior to IOL  Labor: Not in labor yet. Continue cytotec for cervical ripening. Fetal Wellbeing:  Category I Pain Control:  Labor support without medications Pre-eclampsia: Labs & BP stable I/D:  GBS negative Anticipated MOD:  NSVD  Ross,Lisa Wynne SNM 02/28/2015, 7:16 PM   Seen also by me Agree with note Aviva Signs, CNM

## 2015-02-28 NOTE — Progress Notes (Signed)
C/o hands/wrists hurt/ tingly. C/o occcasional headaches. Denies visual changes.

## 2015-02-28 NOTE — Progress Notes (Signed)
Trace leuks in urine.  

## 2015-02-28 NOTE — Telephone Encounter (Signed)
Preadmission screen  

## 2015-02-28 NOTE — Progress Notes (Signed)
Patient is doing well without complaints. FM/labor precautions reviewed. Patient with elevated BP today, reports occasional headaches relieved by tylenol, no visual changes. Patient did not bring meter or log book this morning but reports highest fasting in 70's and pp values ranging from 115-120. Will schedule IOL at 40 weeks on 5/4 Will send to MAU for further evaluation of elevated BP Growth ultrasound scheduled on 4/28

## 2015-02-28 NOTE — Progress Notes (Signed)
IOL 03/09/15 @ 730p

## 2015-02-28 NOTE — H&P (Signed)
Chelsea Harper is a 34 y.o. female presenting for Induction of Labor. Maternal Medical History:  Reason for admission: Nausea. [name] who presents with gestational hypertension. Labs pending, no proteinuria. ALso a diet controlled diabetic (well controlled).   Contractions: Frequency: rare.   Perceived severity is mild.    Fetal activity: Perceived fetal activity is normal.   Last perceived fetal movement was within the past hour.    Prenatal complications: PIH.   No bleeding or pre-eclampsia (work up in process).   Prenatal Complications - Diabetes: gestational. Diabetes is managed by diet.      OB History    Gravida Para Term Preterm AB TAB SAB Ectopic Multiple Living   1              Past Medical History  Diagnosis Date  . Gestational diabetes    Past Surgical History  Procedure Laterality Date  . No past surgeries     Family History: family history includes Cancer in her mother; Diabetes in her mother; Heart disease in her mother; Hypertension in her brother. Social History:  reports that she has never smoked. She has never used smokeless tobacco. She reports that she does not drink alcohol or use illicit drugs.   Review of Systems  Constitutional: Negative for fever, chills and malaise/fatigue.  Eyes: Negative for blurred vision and double vision.  Gastrointestinal: Negative for nausea, vomiting, abdominal pain, diarrhea and constipation.  Neurological: Positive for headaches. Negative for dizziness, focal weakness and weakness.      Blood pressure 130/75, pulse 79, temperature 98.1 F (36.7 C), temperature source Oral, resp. rate 20, last menstrual period 06/02/2014. Maternal Exam:  Uterine Assessment: Contraction strength is mild.  Contraction frequency is rare.   Abdomen: Patient reports no abdominal tenderness. Estimated fetal weight is 8++.    Introitus: Normal vulva. Normal vagina.  Ferning test: not done.  Nitrazine test: not  done. Amniotic fluid character: not assessed.  Pelvis: adequate for delivery.   Cervix: Cervix evaluated by digital exam.     Fetal Exam Fetal Monitor Review: Mode: ultrasound.   Baseline rate: 140.  Variability: moderate (6-25 bpm).   Pattern: accelerations present and no decelerations.    Fetal State Assessment: Category I - tracings are normal.     Physical Exam  Constitutional: She is oriented to person, place, and time. She appears well-developed and well-nourished. No distress.  HENT:  Head: Normocephalic.  Cardiovascular: Normal rate, regular rhythm and normal heart sounds.  Exam reveals no gallop and no friction rub.   No murmur heard. Respiratory: Effort normal. No respiratory distress. She has no wheezes. She has no rales. She exhibits no tenderness.  GI: Soft. She exhibits no distension. There is no tenderness. There is no rebound and no guarding.  Musculoskeletal: Normal range of motion.  Neurological: She is alert and oriented to person, place, and time.  Skin: Skin is warm and dry.  Psychiatric: She has a normal mood and affect.    Prenatal labs: ABO, Rh: O/POS/-- (12/01 1436) Antibody: NEG (12/01 1436) Rubella: 0.91 (12/01 1436) RPR: NON REAC (02/04 1718)  HBsAg: NEGATIVE (12/01 1436)  HIV: NONREACTIVE (02/04 1718)  GBS: Negative (04/11 0000)   Assessment/Plan: A:  SIUP at [redacted]w[redacted]d       Gestational Hypertension      R/O Preeclampsia      Diet Controlled Diabetes  P:  Consulted Dr Chelsea Harper       Admit to [employer] [employer]  Recommend induction of labor due to Gest HTN       Will probably require Cytotec ripening Will check cx when admitted   [name],Chelsea 02/28/2015, 12:30 PM

## 2015-02-28 NOTE — MAU Note (Signed)
Has been stressed; dad was rushed to hosp and is not doing well.  BP and swelling are new things. No headache now;  Denies visual changes and epigastric pain.

## 2015-03-01 ENCOUNTER — Inpatient Hospital Stay (HOSPITAL_COMMUNITY): Payer: PRIVATE HEALTH INSURANCE | Admitting: Anesthesiology

## 2015-03-01 ENCOUNTER — Encounter (HOSPITAL_COMMUNITY): Payer: Self-pay | Admitting: Anesthesiology

## 2015-03-01 LAB — GLUCOSE, CAPILLARY
GLUCOSE-CAPILLARY: 89 mg/dL (ref 70–99)
GLUCOSE-CAPILLARY: 106 mg/dL — AB (ref 70–99)
Glucose-Capillary: 119 mg/dL — ABNORMAL HIGH (ref 70–99)
Glucose-Capillary: 106 mg/dL — ABNORMAL HIGH (ref 70–99)
Glucose-Capillary: 103 mg/dL — ABNORMAL HIGH (ref 70–99)

## 2015-03-01 LAB — CBC
HEMATOCRIT: 31.1 % — AB (ref 36.0–46.0)
HEMOGLOBIN: 10.3 g/dL — AB (ref 12.0–15.0)
MCH: 27.2 pg (ref 26.0–34.0)
MCHC: 33.1 g/dL (ref 30.0–36.0)
MCV: 82.3 fL (ref 78.0–100.0)
PLATELETS: 261 10*3/uL (ref 150–400)
RBC: 3.78 MIL/uL — ABNORMAL LOW (ref 3.87–5.11)
RDW: 16.5 % — AB (ref 11.5–15.5)
WBC: 14.6 10*3/uL — AB (ref 4.0–10.5)

## 2015-03-01 LAB — RPR: RPR Ser Ql: NONREACTIVE

## 2015-03-01 LAB — HIV ANTIBODY (ROUTINE TESTING W REFLEX): HIV Screen 4th Generation wRfx: NONREACTIVE

## 2015-03-01 MED ORDER — OXYTOCIN 40 UNITS IN LACTATED RINGERS INFUSION - SIMPLE MED
1.0000 m[IU]/min | INTRAVENOUS | Status: DC
  Administered 2015-03-01: 1 m[IU]/min via INTRAVENOUS
  Administered 2015-03-01: 2 m[IU]/min via INTRAVENOUS
  Administered 2015-03-01: 3 m[IU]/min via INTRAVENOUS
  Filled 2015-03-01: qty 1000

## 2015-03-01 MED ORDER — PHENYLEPHRINE 40 MCG/ML (10ML) SYRINGE FOR IV PUSH (FOR BLOOD PRESSURE SUPPORT)
80.0000 ug | PREFILLED_SYRINGE | INTRAVENOUS | Status: AC | PRN
  Administered 2015-03-01 (×3): 80 ug via INTRAVENOUS

## 2015-03-01 MED ORDER — FENTANYL 2.5 MCG/ML BUPIVACAINE 1/10 % EPIDURAL INFUSION (WH - ANES)
14.0000 mL/h | INTRAMUSCULAR | Status: DC | PRN
  Administered 2015-03-01 (×3): 14 mL/h via EPIDURAL
  Filled 2015-03-01: qty 125

## 2015-03-01 MED ORDER — TERBUTALINE SULFATE 1 MG/ML IJ SOLN: 0.2500 mg | Freq: Once | INTRAMUSCULAR | Status: DC

## 2015-03-01 MED ORDER — LACTATED RINGERS IV SOLN
INTRAVENOUS | Status: DC
  Administered 2015-03-01: 300 mL via INTRAUTERINE
  Administered 2015-03-02: 100 mL via INTRAUTERINE

## 2015-03-01 MED ORDER — EPHEDRINE 5 MG/ML INJ
10.0000 mg | INTRAVENOUS | Status: AC | PRN
  Administered 2015-03-01 (×2): 10 mg via INTRAVENOUS

## 2015-03-01 MED ORDER — LIDOCAINE HCL (PF) 1 % IJ SOLN
INTRAMUSCULAR | Status: DC | PRN
  Administered 2015-03-01: 9 mL
  Administered 2015-03-01: 1 mL

## 2015-03-01 MED ORDER — EPHEDRINE 5 MG/ML INJ
10.0000 mg | INTRAVENOUS | Status: DC | PRN
  Filled 2015-03-01: qty 2
  Filled 2015-03-01: qty 4

## 2015-03-01 MED ORDER — PHENYLEPHRINE 40 MCG/ML (10ML) SYRINGE FOR IV PUSH (FOR BLOOD PRESSURE SUPPORT)
80.0000 ug | PREFILLED_SYRINGE | INTRAVENOUS | Status: AC | PRN
  Administered 2015-03-01 (×3): 80 ug via INTRAVENOUS
  Filled 2015-03-01: qty 20

## 2015-03-01 MED ORDER — FENTANYL 2.5 MCG/ML BUPIVACAINE 1/10 % EPIDURAL INFUSION (WH - ANES)
INTRAMUSCULAR | Status: AC
  Administered 2015-03-01: 14 mL/h via EPIDURAL
  Filled 2015-03-01: qty 125

## 2015-03-01 MED ORDER — ZOLPIDEM TARTRATE 5 MG PO TABS
5.0000 mg | ORAL_TABLET | Freq: Every evening | ORAL | Status: DC | PRN
  Administered 2015-03-01: 5 mg via ORAL
  Filled 2015-03-01: qty 1

## 2015-03-01 MED ORDER — BUTORPHANOL TARTRATE 2 MG/ML IJ SOLN
2.0000 mg | INTRAMUSCULAR | Status: DC | PRN
  Administered 2015-03-01: 2 mg via INTRAVENOUS
  Filled 2015-03-01: qty 2

## 2015-03-01 MED ORDER — PHENYLEPHRINE 40 MCG/ML (10ML) SYRINGE FOR IV PUSH (FOR BLOOD PRESSURE SUPPORT)
PREFILLED_SYRINGE | INTRAVENOUS | Status: AC
  Administered 2015-03-01: 80 ug via INTRAVENOUS
  Filled 2015-03-01: qty 20

## 2015-03-01 MED ORDER — TERBUTALINE SULFATE 1 MG/ML IJ SOLN: 0.2500 mg | Freq: Once | INTRAMUSCULAR | Status: AC | PRN

## 2015-03-01 MED ORDER — TERBUTALINE SULFATE 1 MG/ML IJ SOLN
INTRAMUSCULAR | Status: AC
  Filled 2015-03-01: qty 1

## 2015-03-01 MED ORDER — FENTANYL CITRATE (PF) 100 MCG/2ML IJ SOLN
100.0000 ug | INTRAMUSCULAR | Status: DC | PRN
  Administered 2015-03-01 (×2): 100 ug via INTRAVENOUS
  Filled 2015-03-01: qty 2

## 2015-03-01 MED ORDER — LACTATED RINGERS IV SOLN
500.0000 mL | Freq: Once | INTRAVENOUS | Status: AC
  Administered 2015-03-01: 500 mL via INTRAVENOUS

## 2015-03-01 MED ORDER — DIPHENHYDRAMINE HCL 50 MG/ML IJ SOLN: 12.5000 mg | INTRAMUSCULAR | Status: DC | PRN

## 2015-03-01 MED ORDER — FENTANYL CITRATE (PF) 100 MCG/2ML IJ SOLN
INTRAMUSCULAR | Status: AC
  Administered 2015-03-01: 100 ug via INTRAVENOUS
  Filled 2015-03-01: qty 2

## 2015-03-01 NOTE — Progress Notes (Signed)
Labor Progress Note  S: comfortable with epidural  O:  BP 128/80 mmHg  Pulse 77  Temp(Src) 97.6 F (36.4 C) (Oral)  Resp 20  Ht 5' 6.5" (1.689 m)  Wt 327 lb (148.326 kg)  BMI 51.99 kg/m2  SpO2 98%  LMP 06/02/2014 (Approximate) Cat 2 2/2 recurrent lates after epidural, now cat I CVE:  @1030  1/80/-3 FB placed @1130  5/80/-2 SROM and FB out @1400  5/80/-2 IUPC placed, FSE, amnioinfusion  A&P: 34 y.o. G1P0 2434w6d IOL 2/2 gHTN, A1DM # labor: rapid progression after FB placed, now stable, BP wnl and no longer with late decelerations.  Will start pitocin # gHTN: no severe range # A1DM: continue to monitor CBGs 119, 89  Chelsea Harper ROCIO, MD 3:42 PM

## 2015-03-01 NOTE — Progress Notes (Signed)
Labor Progress Note  S: comfortable with epidural  O:  BP 143/75 mmHg  Pulse 84  Temp(Src) 97.6 F (36.4 C) (Oral)  Resp 20  Ht 5' 6.5" (1.689 m)  Wt 327 lb (148.326 kg)  BMI 51.99 kg/m2  SpO2 98%  LMP 06/02/2014 (Approximate) Cat 2, late decels with q365min contractions CVE:  @1030  1/80/-3 FB placed @1130  5/80/-2 SROM and FB out @1400  5/80/-2 IUPC placed, FSE, amnioinfusion 2/2 recurrent late decelerations @1720  recurrent late decelerations, MVU 125 => decr pitocin 3 to 1mu  A&P: 34 y.o. G1P0 5536w6d IOL 2/2 gHTN, A1DM # labor: guarded, decrease pitocin from 3mu to 1mu with position changes # gHTN: no severe range # A1DM: continue to monitor CBGs  Perry MountACOSTA,Stepheny Canal ROCIO, MD 5:20 PM

## 2015-03-01 NOTE — Anesthesia Preprocedure Evaluation (Addendum)
Anesthesia Evaluation  Patient identified by MRN, date of birth, ID band Patient awake    Reviewed: Allergy & Precautions, Patient's Chart, lab work & pertinent test results  Airway Mallampati: III  TM Distance: >3 FB Neck ROM: Full    Dental no notable dental hx.    Pulmonary neg pulmonary ROS,  breath sounds clear to auscultation  Pulmonary exam normal       Cardiovascular hypertension, Pt. on medications Rhythm:Regular Rate:Normal     Neuro/Psych PSYCHIATRIC DISORDERS Depression negative neurological ROS     GI/Hepatic Neg liver ROS, GERD-  Medicated and Controlled,  Endo/Other  diabetes, Well Controlled, GestationalMorbid obesity  Renal/GU negative Renal ROS  negative genitourinary   Musculoskeletal negative musculoskeletal ROS (+)   Abdominal (+) + obese,   Peds  Hematology  (+) anemia ,   Anesthesia Other Findings   Reproductive/Obstetrics (+) Pregnancy                            Anesthesia Physical Anesthesia Plan  ASA: III  Anesthesia Plan: Epidural   Post-op Pain Management:    Induction:   Airway Management Planned: Natural Airway  Additional Equipment:   Intra-op Plan:   Post-operative Plan:   Informed Consent: I have reviewed the patients History and Physical, chart, labs and discussed the procedure including the risks, benefits and alternatives for the proposed anesthesia with the patient or authorized representative who has indicated his/her understanding and acceptance.     Plan Discussed with: Anesthesiologist  Anesthesia Plan Comments:         Anesthesia Quick Evaluation

## 2015-03-01 NOTE — Anesthesia Procedure Notes (Addendum)
Epidural Patient location during procedure: OB Start time: 03/01/2015 12:35 PM  Staffing Anesthesiologist: Mal AmabileFOSTER, Tyrese Capriotti Performed by: anesthesiologist   Preanesthetic Checklist Completed: patient identified, site marked, surgical consent, pre-op evaluation, timeout performed, IV checked, risks and benefits discussed and monitors and equipment checked  Epidural Patient position: sitting Prep: site prepped and draped and DuraPrep Patient monitoring: continuous pulse ox and blood pressure Approach: midline Location: L3-L4 Injection technique: LOR air  Needle:  Needle type: Tuohy  Needle gauge: 17 G Needle length: 15 cm and 15 Needle insertion depth: 5 cm and 12.5 cm Catheter type: closed end flexible Catheter size: 19 Gauge Catheter at skin depth: 10 and 19 cm Test dose: negative and Other  Assessment Events: blood not aspirated, injection not painful, no injection resistance, negative IV test and no paresthesia  Additional Notes Patient identified. Risks and benefits discussed including failed block, incomplete  Pain control, post dural puncture headache, nerve damage, paralysis, blood pressure Changes, nausea, vomiting, reactions to medications-both toxic and allergic and post Partum back pain. All questions were answered. Patient expressed understanding and wished to proceed. Sterile technique was used throughout procedure. Epidural site was Dressed with sterile barrier dressing. No paresthesias, signs of intravascular injection Or signs of intrathecal spread were encountered. Difficult due to poor landmarks from super MO. Attempt x 4. Patient was more comfortable after the epidural was dosed. Please see RN's note for documentation of vital signs and FHR which are stable.

## 2015-03-01 NOTE — Progress Notes (Signed)
Labor Progress Note  S: requests more fentanyl 2/2 backpain  O:  BP 134/63 mmHg  Pulse 85  Temp(Src) 97.5 F (36.4 C) (Oral)  Resp 22  Ht 5' 6.5" (1.689 m)  Wt 327 lb (148.326 kg)  BMI 51.99 kg/m2  LMP 06/02/2014 (Approximate) Cat 1, very difficult to trace 2/2 morbid obesity CVE: 1/80/-3, unable to palpate presenting part, cephalic presentation confirmed on sono with some technical difficulty 2/2 depth/very thick panus.  A&P: 34 y.o. G1P0 2349w6d IOL 2/2 gHTN, A1DM # gHTN: no severe range # labor: FB placed @ 1030, no more cytotec as unable to adequately monitor contractions # A1DM: continue to monitor CBGs 119, 89  Saundra Gin ROCIO, MD 10:42 AM

## 2015-03-01 NOTE — Progress Notes (Addendum)
Patient ID: Chelsea Harper, female   DOB: 01/09/1981, 34 y.o.   MRN: [id number] Doing well but tired  Filed Vitals:   03/01/15 2103 03/01/15 2121 03/01/15 2125 03/01/15 2133  BP: 168/87 161/129 133/60 138/72  Pulse: 84 105 82 79  Temp: 97.6 F (36.4 C)     TempSrc: Oral     Resp: 18 24 24 20   Height:      Weight:      SpO2:       Fetal heart rate improved with good variability and accels There are several late and variable decels, but other tracing is reassuring  Dilation: 6 Effacement (%): 50 (puffy) Cervical Position: Middle Station: 0 Presentation: Vertex Exam by:: Rogelio SeenMcCall, RN  UCs spaced out to q4-6 min  Results for Chelsea Harper, Gerre (MRN [id number]) as of 03/01/2015 21:39  Ref. Range 03/01/2015 19:26  Glucose-Capillary Latest Ref Range: 70-99 mg/dL 846106 (H)   Needs to have Pitocin increased (RN had stopped increasing due to decels).  Will try to iincrease so we can get contractions increased

## 2015-03-01 NOTE — Progress Notes (Signed)
Reported SVE to CNM. Patient has more than 3 UCs in 10 min when patient semi-fowlers, they palpate mild. CNM will place cytotec, let patient have breakfast and begin pitocin 4 hours after cytotec placement.

## 2015-03-01 NOTE — Progress Notes (Signed)
Reported that patient contracting too much for cytotec placement. Reported SVE, patient comfortable with stadol. Confirmed order to check CBG every 4 hours. [place] CNM ordered to wait to see if UCs space to place cytotec. If unable to place cytotec by 530am, do SVE and start pitocin.

## 2015-03-02 ENCOUNTER — Encounter (HOSPITAL_COMMUNITY)
Admission: AD | Disposition: A | Discharge status: Home / Self Care | Payer: Self-pay | Source: Ambulatory Visit | Attending: Family Medicine

## 2015-03-02 ENCOUNTER — Encounter (HOSPITAL_COMMUNITY): Payer: Self-pay | Admitting: *Deleted

## 2015-03-02 DIAGNOSIS — O133 Gestational [pregnancy-induced] hypertension without significant proteinuria, third trimester: Secondary | ICD-10-CM

## 2015-03-02 DIAGNOSIS — Z3A39 39 weeks gestation of pregnancy: Secondary | ICD-10-CM

## 2015-03-02 DIAGNOSIS — O24420 Gestational diabetes mellitus in childbirth, diet controlled: Secondary | ICD-10-CM

## 2015-03-02 SURGERY — Surgical Case
Anesthesia: Regional

## 2015-03-02 MED ORDER — SIMETHICONE 80 MG PO CHEW: 80.0000 mg | CHEWABLE_TABLET | ORAL | Status: DC | PRN

## 2015-03-02 MED ORDER — OXYCODONE-ACETAMINOPHEN 5-325 MG PO TABS: 2.0000 | ORAL_TABLET | ORAL | Status: DC | PRN

## 2015-03-02 MED ORDER — WITCH HAZEL-GLYCERIN EX PADS: 1.0000 "application " | MEDICATED_PAD | CUTANEOUS | Status: DC | PRN

## 2015-03-02 MED ORDER — ONDANSETRON HCL 4 MG PO TABS: 4.0000 mg | ORAL_TABLET | ORAL | Status: DC | PRN

## 2015-03-02 MED ORDER — DIPHENHYDRAMINE HCL 25 MG PO CAPS: 25.0000 mg | ORAL_CAPSULE | Freq: Four times a day (QID) | ORAL | Status: DC | PRN

## 2015-03-02 MED ORDER — IBUPROFEN 600 MG PO TABS
600.0000 mg | ORAL_TABLET | Freq: Four times a day (QID) | ORAL | Status: DC
  Administered 2015-03-02 – 2015-03-04 (×9): 600 mg via ORAL
  Filled 2015-03-02 (×9): qty 1

## 2015-03-02 MED ORDER — SENNOSIDES-DOCUSATE SODIUM 8.6-50 MG PO TABS
2.0000 | ORAL_TABLET | ORAL | Status: DC
  Administered 2015-03-02: 2 via ORAL
  Filled 2015-03-02 (×2): qty 2

## 2015-03-02 MED ORDER — DIBUCAINE 1 % RE OINT: 1.0000 "application " | TOPICAL_OINTMENT | RECTAL | Status: DC | PRN

## 2015-03-02 MED ORDER — LANOLIN HYDROUS EX OINT: TOPICAL_OINTMENT | CUTANEOUS | Status: DC | PRN

## 2015-03-02 MED ORDER — ZOLPIDEM TARTRATE 5 MG PO TABS: 5.0000 mg | ORAL_TABLET | Freq: Every evening | ORAL | Status: DC | PRN

## 2015-03-02 MED ORDER — BENZOCAINE-MENTHOL 20-0.5 % EX AERO
1.0000 "application " | INHALATION_SPRAY | CUTANEOUS | Status: DC | PRN
  Filled 2015-03-02: qty 56

## 2015-03-02 MED ORDER — TETANUS-DIPHTH-ACELL PERTUSSIS 5-2.5-18.5 LF-MCG/0.5 IM SUSP: 0.5000 mL | Freq: Once | INTRAMUSCULAR | Status: DC

## 2015-03-02 MED ORDER — OXYCODONE-ACETAMINOPHEN 5-325 MG PO TABS
1.0000 | ORAL_TABLET | ORAL | Status: DC | PRN
  Filled 2015-03-02: qty 1

## 2015-03-02 MED ORDER — ONDANSETRON HCL 4 MG/2ML IJ SOLN: 4.0000 mg | INTRAMUSCULAR | Status: DC | PRN

## 2015-03-02 MED ORDER — ACETAMINOPHEN 325 MG PO TABS: 650.0000 mg | ORAL_TABLET | ORAL | Status: DC | PRN

## 2015-03-02 MED ORDER — PRENATAL MULTIVITAMIN CH
1.0000 | ORAL_TABLET | Freq: Every day | ORAL | Status: DC
  Administered 2015-03-02 – 2015-03-03 (×2): 1 via ORAL
  Filled 2015-03-02 (×2): qty 1

## 2015-03-02 SURGICAL SUPPLY — 34 items
BENZOIN TINCTURE PRP APPL 2/3 (GAUZE/BANDAGES/DRESSINGS) IMPLANT
CATH ROBINSON RED A/P 16FR (CATHETERS) IMPLANT
CLAMP CORD UMBIL (MISCELLANEOUS) IMPLANT
CLOSURE WOUND 1/2 X4 (GAUZE/BANDAGES/DRESSINGS)
CLOTH BEACON ORANGE TIMEOUT ST (SAFETY) IMPLANT
DRAPE SHEET LG 3/4 BI-LAMINATE (DRAPES) IMPLANT
DRSG OPSITE POSTOP 4X10 (GAUZE/BANDAGES/DRESSINGS) IMPLANT
DURAPREP 26ML APPLICATOR (WOUND CARE) IMPLANT
ELECT REM PT RETURN 9FT ADLT (ELECTROSURGICAL) ×3
ELECTRODE REM PT RTRN 9FT ADLT (ELECTROSURGICAL) ×1 IMPLANT
EXTRACTOR VACUUM M CUP 4 TUBE (SUCTIONS) IMPLANT
EXTRACTOR VACUUM M CUP 4' TUBE (SUCTIONS)
GLOVE BIOGEL PI IND STRL 7.5 (GLOVE) IMPLANT
GLOVE BIOGEL PI INDICATOR 7.5 (GLOVE)
GLOVE ECLIPSE 7.5 STRL STRAW (GLOVE) IMPLANT
GOWN STRL REUS W/TWL LRG LVL3 (GOWN DISPOSABLE) IMPLANT
KIT ABG SYR 3ML LUER SLIP (SYRINGE) IMPLANT
NEEDLE HYPO 22GX1.5 SAFETY (NEEDLE) IMPLANT
NEEDLE HYPO 25X5/8 SAFETYGLIDE (NEEDLE) IMPLANT
NS IRRIG 1000ML POUR BTL (IV SOLUTION) IMPLANT
PACK C SECTION WH (CUSTOM PROCEDURE TRAY) IMPLANT
PAD OB MATERNITY 4.3X12.25 (PERSONAL CARE ITEMS) IMPLANT
RTRCTR C-SECT PINK 25CM LRG (MISCELLANEOUS) IMPLANT
STRIP CLOSURE SKIN 1/2X4 (GAUZE/BANDAGES/DRESSINGS) IMPLANT
SUT MNCRL 0 VIOLET CTX 36 (SUTURE) IMPLANT
SUT MONOCRYL 0 CTX 36 (SUTURE)
SUT VIC AB 0 CTX 36 (SUTURE)
SUT VIC AB 0 CTX36XBRD ANBCTRL (SUTURE) IMPLANT
SUT VIC AB 2-0 CT1 27 (SUTURE)
SUT VIC AB 2-0 CT1 TAPERPNT 27 (SUTURE) IMPLANT
SUT VIC AB 4-0 KS 27 (SUTURE) IMPLANT
SYR 30ML LL (SYRINGE) IMPLANT
TOWEL OR 17X24 6PK STRL BLUE (TOWEL DISPOSABLE) IMPLANT
TRAY FOLEY CATH SILVER 14FR (SET/KITS/TRAYS/PACK) IMPLANT

## 2015-03-02 NOTE — Anesthesia Postprocedure Evaluation (Signed)
  Anesthesia Post-op Note  Patient: Sherrilyn RistNakesha Haymond  Procedure(s) Performed: * No procedures listed *  Patient Location: Mother/Baby  Anesthesia Type:Epidural  Level of Consciousness: awake, alert , oriented and patient cooperative  Airway and Oxygen Therapy: Patient Spontanous Breathing  Post-op Pain: mild  Post-op Assessment: Post-op Vital signs reviewed, Patient's Cardiovascular Status Stable, Respiratory Function Stable, Patent Airway, No signs of Nausea or vomiting, Adequate PO intake, Pain level controlled, No headache, No backache, No residual numbness and No residual motor weakness  Post-op Vital Signs: Reviewed and stable  Last Vitals:  Filed Vitals:   03/02/15 0647  BP: 136/62  Pulse: 79  Temp: 37.4 C  Resp: 18    Complications: No apparent anesthesia complications

## 2015-03-02 NOTE — Lactation Note (Signed)
This note was copied from the chart of Chelsea Sherrilyn Ristakesha Koller. Lactation Consultation Note  Patient Name: Chelsea Harper Reason for consult: Initial assessment  Baby 10 1/2 hours old and has been to the breast several times. Baby voided small amount at consult and has stooled now several times. LC reviewed basics prior to latch - breast massage, hand express, latch with tea cup hold Until the baby is in a consistent pattern. LC assisted mom with latch , positioning and depth . Multiply swallows noted and baby fed 8 mins, and released. Mom needed a lot of assistance to  Obtain depth. Baby rested for a few minutes on moms chest and started showing feeding cues. LC assisted with re-latch. Depth obtained and multiply swallows noted. Mom has a large amount of  Colostrum. Per mom [facility]. Mother informed of post-discharge support and given phone number to the lactation department, including services  for phone call assistance; out-patient appointments; and breastfeeding support group. List of other breastfeeding  resources in the community given in the handout. Encouraged mother to call for problems or concerns related to breastfeeding.    Maternal Data Has patient been taught Hand Expression?: Yes Does the patient have breastfeeding experience prior to this delivery?: No  Feeding Feeding Type: Breast Fed Length of feed: 8 min  LATCH Score/Interventions Latch: Grasps breast easily, tongue down, lips flanged, rhythmical sucking. Intervention(s): Adjust position;Assist with latch;Breast massage;Breast compression  Audible Swallowing: A few with stimulation  Type of Nipple: Everted at rest and after stimulation  Comfort (Breast/Nipple): Soft / non-tender     Hold (Positioning): Assistance needed to correctly position infant at breast and maintain latch. Intervention(s): Breastfeeding basics  reviewed;Support Pillows;Position options;Skin to skin  LATCH Score: 8  Lactation Tools Discussed/Used WIC Program: Yes (per mom [place]   Consult Status Consult Status: Follow-up Date: 03/03/15 Follow-up type: In-patient    Kathrin Greathouseorio, Chelsea Harper Harper, 1:59 PM

## 2015-03-02 NOTE — Progress Notes (Signed)
SEE BREASTFEEDING ON INFANT CHART

## 2015-03-03 ENCOUNTER — Ambulatory Visit (HOSPITAL_COMMUNITY): Payer: PRIVATE HEALTH INSURANCE

## 2015-03-03 MED ORDER — MEASLES, MUMPS & RUBELLA VAC ~~LOC~~ INJ
0.5000 mL | INJECTION | Freq: Once | SUBCUTANEOUS | Status: DC
  Filled 2015-03-03: qty 0.5

## 2015-03-03 NOTE — Progress Notes (Signed)
CLINICAL SOCIAL WORK MATERNAL/CHILD NOTE  Patient Details  Name: Chelsea Harper MRN: [id number] Date of Birth: 03/02/2013  Date:  03/03/2015  Clinical Social Worker Initiating Note:  Lucita Ferrara, LCSW Date/ Time Initiated:  03/03/15/1000     Child's Name:  Chelsea Harper   Legal Guardian:  Mother   Need for Interpreter:  None   Date of Referral:  03/02/15     Reason for Referral:  History of depression   Referral Source:  [facility]   Address:  [address] [place], [place] [address]  Phone number:  [phone]   Household Members:  Self   Natural Supports (not living in the home):  Immediate Family, Friends   Medical illustrator Supports: None   Employment: Full-time   Type of Work: Education officer, 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  2) Family/Relationship Issues: MOB and FOB ended their relationship during the pregnancy, and she stated that he is not involved. She identified this as a stressor. Her father is also currently hospitalized at [facility] and has been in critical condition.  3) Adjustment to motherhood: MOB reported feeling tired and overwhelmed, and is primarily overwhelmed with breastfeeding.  4) Due to history of depression and current psychosocial stressors, MOB presents with increased risk for developing postpartum depression.   Cognitive State:  Able to Concentrate , Alert , Goal Oriented , Linear Thinking    Mood/Affect:  MOB presented with a flat affect, and displayed a limited range in affect.  She was noted to smile briefly when her strengths were reviewed, and when she discussed what she has enjoyed about motherhood so far.  MOB is notably tired and exhausted  from her limited sleep in past few days.   CSW Assessment: CSW met with MOB due to history of depression. MOB required minimal time to establish rapport. Upon arrival to her room, MOB began to discuss feeling overwhelmed and tired.  CSW provided supportive listening, validated her feelings, and provided her with opportunities to discuss her feelings. CSW guided the MOB to identify and reflect upon her feelings of stress, and MOB began by focusing on the frustration she has felt secondary to breastfeeding. She stated that during the pregnancy she wanted to "give in a shot", but shared that it has been more difficult that anticipates.  MOB shared that she feels comfortable with her decision to supplement with formula since she does not want to feel frustrated, but continues to want to try to breastfeed.  MOB discussed goal of taking it "one day at a time" and doing "whatever is best for [place]".  She continues to process how she wants to proceed with feeding, but stated that she hopes to work with [facility] more today for additional support.   MOB continued to process feeling overwhelmed as she becomes a mother since she and the FOB are not together and is not involved.  She stated, "that's a whole another story", and shared that she felt stress during the pregnancy because of this separation and feeling the need to "prepare for everything on my own".  She stated that she does have family support, but reflected upon her mother dying a few years ago and her father currently being hospitalized at [facility] for acute medical concerns.  She stated that despite having support from her family, she will primarily be doing it "alone".  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   When CSW inquired about history of depression, she endorsed chronic symptoms of dysthymia.  MOB denied history of mental health  crises, and denied history of SI.  She stated that she attempts to take situations, "one day at a time", and endorsed remaining focused on the infant when she begins to feel overwhelmed.  MOB acknowledged increased risk of developing postpartum depression, and agreed to contact her OB if she notes symptoms.  She denied needing a therapist at this time, and shared belief that she will be able to work through her feelings secondary to her stressors at this time.    Despite MOB's feelings of stress and worry as she becomes a mother and copes with additional stressors, she stated that she has found moments of happiness and positivity since the infant has been born. She was noted to be smiling as she reflected upon the type of mother she wants to be and how she envisions her infant interacting with her sisters' children.  MOB stated that she is looking forward to be a mother, and was receptive to exploring how gratitude can assist her to increase hope and motivation.   MOB expressed appreciation for the visit. She denied questions, concerns, or needs at this time. MOB agreed to contact CSW as needs arise.   CSW Plan/Description:  1) Information/Referral to [employer]: MOB declined need for referrals for therapy and/or parenting support.  2) Patient/Family Education: CSW provided education on postpartum depression. MOB acknowledged her increased risk due to psychosocial stressors and mental health history.  MOB agreed to contact her OB if she notes symptoms.  3)No Further Intervention Required/No Barriers to Discharge    Sharyl Nimrod 03/03/2015, 10:35 AM

## 2015-03-03 NOTE — Progress Notes (Signed)
Post Partum Day 1 Subjective:  Chelsea Harper is a 34 y.o. G1P1001 1723w0d s/p SVD.  No acute events overnight.  Pt denies problems with ambulating, voiding or po intake.  She denies nausea or vomiting.  Pain is well controlled.  She has not had flatus. She has not had a bowel movement.  Lochia Minimal.  Plan for birth control is OCP's.  Method of Feeding: Breast  Objective: Blood pressure 130/67, pulse 99, temperature 98 F (36.7 C), temperature source Oral, resp. rate 18, height 5\' 6"  (1.676 m), weight 146.512 kg (323 lb), last menstrual period 06/02/2014, SpO2 99 %, unknown if currently breastfeeding.  Physical Exam:  General: alert, cooperative and no distress Lochia:normal flow Chest: CTAB without wheezing or rales Heart: RRR no m/r/g Abdomen: +BS, soft, nontender,  Uterine Fundus: firm, midline DVT Evaluation: No evidence of DVT seen on physical exam. Extremities: Minimal edema; Distal pulses 2+ bilaterally   Recent Labs  02/28/15 1235 03/01/15 1145  HGB 10.3* 10.3*  HCT 30.3* 31.1*    Assessment/Plan:  ASSESSMENT: Chelsea Harper is a 34 y.o. G1P1001 4123w0d s/p SVD.  Plan for discharge tomorrow  Feeding: Breast Contraception: OCP's Circumcision: N/A - Girl   LOS: 3 days   Wandra MannanManning, Brittany, PA-S 03/03/2015, 8:58 AM   Pt reexamined by CNM.  ROS: Mild cramping, tolerating PO. Voiding. No dizziness.   General: NAD, alert, No Pallor Lochia: Small amoutn lochia rubra Abdomen: NT, FF U/1  DVT Evaluation: No evidence of DVT seen on physical exam. Extremities: 1+ edema  PPS#1 SVD doing well.  D/C tomorrow  Dorathy KinsmanVirginia Loki Wuthrich, CNM 03/03/2015 11:19 AM

## 2015-03-03 NOTE — Lactation Note (Signed)
This note was copied from the chart of Chelsea Sherrilyn Ristakesha Geyer. Lactation Consultation Note Has large pendulum breast w/flat nipples, having to wear NS. Mom can't apply NS, very frustrated w/NS and says she's not going to BF if she has to wear them. She has tried and can't do it.  Hand expression taught and demonstrated, stated it feels bad, tender and painful. She didn't think she can do this wants to give her colostrum but wants to give bottles. Explained she can pump and bottle feed. Stating this is a lot of trouble. DEBP not available at this time. Has hand pump. Not to interested. Discussed importance of colostrum and breast milk. Mom stated she knew and wanted her baby to get it, she's just having a hard time giving it. Baby has a stuffy nose, pops on and off frequently which discourages mom.  States that she didn't think it would be this challenging. Showed mom all of her colostrum and discussed all of the benefits of giving her baby her breast milk. Mom has WIC, but doesn't have a pump. Has a hand pump. Mom no interested in pumping, explained how thick the colostrum is and hand expression benefits.  Mom requested formula, finger fed baby w/curve tip syring colostrum first then supplemented w/formula. Mom thinks she is not going to BF and wants formula.  Patient Name: Chelsea Harper XBJYN'WToday's Date: 03/03/2015 Reason for consult: Follow-up assessment;Difficult latch   Maternal Data Has patient been taught Hand Expression?: Yes Does the patient have breastfeeding experience prior to this delivery?: No  Feeding Feeding Type: Formula Length of feed: 0 min  LATCH Score/Interventions Latch: Too sleepy or reluctant, no latch achieved, no sucking elicited. Intervention(s): Skin to skin;Waking techniques Intervention(s): Adjust position;Assist with latch;Breast compression  Audible Swallowing: None Intervention(s): Hand expression Intervention(s): Skin to skin;Hand expression  Type of  Nipple: Flat Intervention(s): Hand pump  Comfort (Breast/Nipple): Soft / non-tender     Hold (Positioning): Assistance needed to correctly position infant at breast and maintain latch. Intervention(s): Support Pillows;Position options;Skin to skin  LATCH Score: 4  Lactation Tools Discussed/Used Tools: Nipple Dorris CarnesShields;Pump Nipple shield size: 24 Breast pump type: Manual Initiated by:: RN Date initiated:: 03/02/15   Consult Status Consult Status: Follow-up Date: 03/03/15 (in pm) Follow-up type: In-patient    [name], [name] 03/03/2015, 4:50 AM

## 2015-03-04 DIAGNOSIS — IMO0001 Reserved for inherently not codable concepts without codable children: Secondary | ICD-10-CM

## 2015-03-04 MED ORDER — IBUPROFEN 600 MG PO TABS: 600.0000 mg | ORAL_TABLET | Freq: Four times a day (QID) | ORAL | Status: DC

## 2015-03-04 NOTE — Discharge Summary (Signed)
Obstetric Discharge Summary Reason for Admission: induction of labor Prenatal Procedures: NST Intrapartum Procedures: spontaneous vaginal delivery Postpartum Procedures: none Complications-Operative and Postpartum: none HEMOGLOBIN  Date Value Ref Range Status  03/01/2015 10.3* 12.0 - 15.0 g/dL Final   HCT  Date Value Ref Range Status  03/01/2015 31.1* 36.0 - 46.0 % Final  Hospital Course:  Expand All Collapse All   Chelsea Harper is a 34 y.o. female presenting for Induction of Labor. Maternal Medical History:  Reason for admission: Nausea. This is a 34 y.o. Female at 3725w5d who presents with gestational hypertension. Labs pending, no proteinuria. ALso a diet controlled diabetic (well controlled).      Patient is 34 y.o. G1P0 2648w0d admitted IOL 2/2 gHTN and A1GDM  Delivery Note At 3:14 AM a viable female was delivered via Vaginal, Spontaneous Delivery (Presentation: Right Occiput Anterior). APGAR: 4, 9; weight pending. Infant placed on mother's abdomen, stimulated and dried. Baby with poor tone and slow to rouse initially. Cord quickly clamped and cut by Dr Adrian BlackwaterStinson. Baby carefully rushed to the warmer for additional stimulation. Baby was well by 5 minutes. Hospital cord blood sample collected. Placenta delivered, fundal massage applied and vagina inspected for lacerations. Placenta status: intact. Cord: 3 vessels with the following complications: loose nuchal x1. Dr Adrian BlackwaterStinson and Wynelle BourgeoisMarie Maude Gloor, CNM gloved and actively participated in the entirety of the delivery.  Anesthesia: Epidural  Episiotomy: None Lacerations: None Suture Repair: n/a Est. Blood Loss (mL): 150cc  Mom to postpartum. Baby to Couplet care / Skin to Skin.  Delynn FlavinGottschalk, Ashly M, DO 03/02/2015, 3:42 AM \ Has done well postpartum. Is breastfeeding well. Ready for discharge  Physical Exam:  General: alert and no distress  Filed Vitals:   03/02/15 1816 03/03/15 0520 03/03/15 1833 03/04/15 0607  BP:  110/58 130/67 132/59 128/71  Pulse: 79 99 89 80  Temp:  98 F (36.7 C) 99 F (37.2 C) 98.1 F (36.7 C)  TempSrc:  Oral Oral Oral  Resp: 18 18 18 19   Height:      Weight:      SpO2:  99%  100%    Lochia: appropriate Uterine Fundus: firm Incision: healing well DVT Evaluation: No evidence of DVT seen on physical exam. Does have 1+ edema of feet, no headache  Discharge Diagnoses: Term Pregnancy-delivered                                         A1 Diabetes                                         Gestational hypertension, improved  Discharge Information: Date: 03/04/2015 Activity: unrestricted and pelvic rest Diet: routine Medications: PNV and Ibuprofen Condition: stable Instructions: refer to practice specific booklet Discharge to: home Follow-up Information    [facility] Schedule an appointment as soon as possible for a visit in 4 weeks.   Contact information:   [address] [place] [id number] [phone]      Newborn Data: Live born female  Birth Weight: 6 lb 3.7 oz (2825 g) APGAR: 4, 9  Home with mother.  El[facility] Surgical ServicesWILLIAMS,Cledis Sohn 03/04/2015, 10:07 AM

## 2015-03-04 NOTE — Discharge Instructions (Signed)

## 2015-03-07 ENCOUNTER — Encounter: Payer: PRIVATE HEALTH INSURANCE | Admitting: Obstetrics & Gynecology

## 2015-03-09 ENCOUNTER — Inpatient Hospital Stay (HOSPITAL_COMMUNITY): Admission: RE | Admit: 2015-03-09 | Payer: PRIVATE HEALTH INSURANCE | Source: Ambulatory Visit

## 2015-03-09 ENCOUNTER — Encounter: Payer: Self-pay | Admitting: Obstetrics & Gynecology

## 2015-03-10 NOTE — Addendum Note (Signed)
Addendum  created 03/10/15 1301 by Sebastian Acheheodore Hillary Schwegler, MD   Modules edited: Anesthesia Events

## 2015-04-15 ENCOUNTER — Ambulatory Visit (INDEPENDENT_AMBULATORY_CARE_PROVIDER_SITE_OTHER): Payer: Self-pay | Admitting: Obstetrics & Gynecology

## 2015-04-15 ENCOUNTER — Encounter: Payer: Self-pay | Admitting: Obstetrics & Gynecology

## 2015-04-15 DIAGNOSIS — Z8742 Personal history of other diseases of the female genital tract: Secondary | ICD-10-CM

## 2015-04-15 DIAGNOSIS — Z8632 Personal history of gestational diabetes: Secondary | ICD-10-CM

## 2015-04-15 DIAGNOSIS — Z3041 Encounter for surveillance of contraceptive pills: Secondary | ICD-10-CM

## 2015-04-15 DIAGNOSIS — IMO0001 Reserved for inherently not codable concepts without codable children: Secondary | ICD-10-CM

## 2015-04-15 MED ORDER — NORGESTIMATE-ETH ESTRADIOL 0.25-35 MG-MCG PO TABS
1.0000 | ORAL_TABLET | Freq: Every day | ORAL | Status: DC
Start: 2015-04-15 — End: 2016-03-14

## 2015-04-15 NOTE — Patient Instructions (Signed)
Return to clinic for any obstetric concerns or go to [facility] for evaluation  

## 2015-04-15 NOTE — Progress Notes (Signed)
    Subjective:     Chelsea Harper is a 34 y.o. G72P1001 female who presents for a postpartum visit. She is 6 weeks postpartum following a spontaneous vaginal delivery. I have fully reviewed the prenatal and intrapartum course. The delivery was at 39 gestational weeks; was induced for [facility] and A1GDM. Anesthesia: epidural. Postpartum course has been uncomplicated. Baby's course has been uncomplicated. Baby is feeding by bottle. Bleeding no bleeding. Bowel function is normal. Bladder function is normal. Patient is not sexually active. Contraception method is OCP (estrogen/progesterone). Postpartum depression screening: negative.  The following portions of the patient's history were reviewed and updated as appropriate: allergies, current medications, past family history, past medical history, past social history, past surgical history and problem list. Had normal pap smear in 06/2014 at [facility].  Review of Systems Pertinent items are noted in HPI.   Objective:    BP 133/77 mmHg  Pulse 74  Temp(Src) 98.4 F (36.9 C) (Oral)  Ht 5\' 6"  (1.676 m)  Wt 290 lb 14.4 oz (131.951 kg)  BMI 46.97 kg/m2  LMP 04/11/2015  Breastfeeding? No  General:  alert and no distress   Breasts:  deferred  Lungs: clear to auscultation bilaterally  Heart:  regular rate and rhythm  Abdomen: soft, non-tender; bowel sounds normal; no masses,  no organomegaly   Pelvic:  not evaluated        Assessment:   Normal postpartum exam. Pap smear not done at today's visit.   Plan:    1. Contraception: OCP (estrogen/progesterone) prescribed. 2. Patient will follow up with [facility] for pap smear 3. Follow up for 2 hr GTT here in next six weeks, or for HgA1C with PCP after that time for further screening for Type II DM given history of A1GDM.   Jaynie Collins, MD, FACOG Attending Obstetrician & Gynecologist Faculty Practice, [facility]

## 2015-04-18 ENCOUNTER — Encounter: Payer: Self-pay | Admitting: Obstetrics & Gynecology

## 2015-08-14 ENCOUNTER — Emergency Department (HOSPITAL_BASED_OUTPATIENT_CLINIC_OR_DEPARTMENT_OTHER)
Admission: EM | Admit: 2015-08-14 | Discharge: 2015-08-14 | Disposition: A | Discharge status: Home / Self Care | Payer: PRIVATE HEALTH INSURANCE | Attending: Emergency Medicine | Admitting: Emergency Medicine

## 2015-08-14 ENCOUNTER — Encounter (HOSPITAL_BASED_OUTPATIENT_CLINIC_OR_DEPARTMENT_OTHER): Payer: Self-pay | Admitting: Emergency Medicine

## 2015-08-14 DIAGNOSIS — E669 Obesity, unspecified: Secondary | ICD-10-CM | POA: Diagnosis not present

## 2015-08-14 DIAGNOSIS — A599 Trichomoniasis, unspecified: Secondary | ICD-10-CM | POA: Diagnosis not present

## 2015-08-14 DIAGNOSIS — Z8632 Personal history of gestational diabetes: Secondary | ICD-10-CM | POA: Diagnosis not present

## 2015-08-14 DIAGNOSIS — R1030 Lower abdominal pain, unspecified: Secondary | ICD-10-CM | POA: Diagnosis present

## 2015-08-14 DIAGNOSIS — R42 Dizziness and giddiness: Secondary | ICD-10-CM | POA: Diagnosis not present

## 2015-08-14 DIAGNOSIS — R112 Nausea with vomiting, unspecified: Secondary | ICD-10-CM | POA: Diagnosis not present

## 2015-08-14 DIAGNOSIS — R1084 Generalized abdominal pain: Secondary | ICD-10-CM | POA: Diagnosis not present

## 2015-08-14 DIAGNOSIS — R197 Diarrhea, unspecified: Secondary | ICD-10-CM | POA: Insufficient documentation

## 2015-08-14 DIAGNOSIS — M545 Low back pain: Secondary | ICD-10-CM | POA: Diagnosis not present

## 2015-08-14 DIAGNOSIS — Z3202 Encounter for pregnancy test, result negative: Secondary | ICD-10-CM | POA: Insufficient documentation

## 2015-08-14 LAB — CBC WITH DIFFERENTIAL/PLATELET
BASOS ABS: 0 10*3/uL (ref 0.0–0.1)
Basophils Relative: 0 %
EOS ABS: 0.1 10*3/uL (ref 0.0–0.7)
Eosinophils Relative: 1 %
HCT: 36.4 % (ref 36.0–46.0)
Hemoglobin: 12.1 g/dL (ref 12.0–15.0)
Lymphocytes Relative: 16 %
Lymphs Abs: 1.7 10*3/uL (ref 0.7–4.0)
MCH: 26.9 pg (ref 26.0–34.0)
MCHC: 33.2 g/dL (ref 30.0–36.0)
MCV: 81.1 fL (ref 78.0–100.0)
MONO ABS: 0.4 10*3/uL (ref 0.1–1.0)
Monocytes Relative: 3 %
Neutro Abs: 8.6 10*3/uL — ABNORMAL HIGH (ref 1.7–7.7)
Neutrophils Relative %: 80 %
PLATELETS: 356 10*3/uL (ref 150–400)
RBC: 4.49 MIL/uL (ref 3.87–5.11)
RDW: 14 % (ref 11.5–15.5)
WBC: 10.7 10*3/uL — AB (ref 4.0–10.5)

## 2015-08-14 LAB — URINE MICROSCOPIC-ADD ON

## 2015-08-14 LAB — COMPREHENSIVE METABOLIC PANEL
ALT: 21 U/L (ref 14–54)
AST: 27 U/L (ref 15–41)
Albumin: 3.8 g/dL (ref 3.5–5.0)
Alkaline Phosphatase: 99 U/L (ref 38–126)
Anion gap: 6 (ref 5–15)
BUN: 9 mg/dL (ref 6–20)
CHLORIDE: 106 mmol/L (ref 101–111)
CO2: 25 mmol/L (ref 22–32)
CREATININE: 1.22 mg/dL — AB (ref 0.44–1.00)
Calcium: 8.9 mg/dL (ref 8.9–10.3)
GFR, EST NON AFRICAN AMERICAN: 58 mL/min — AB (ref >60)
Glucose, Bld: 110 mg/dL — ABNORMAL HIGH (ref 65–99)
POTASSIUM: 3.5 mmol/L (ref 3.5–5.1)
SODIUM: 137 mmol/L (ref 135–145)
Total Bilirubin: 0.6 mg/dL (ref 0.3–1.2)
Total Protein: 7.9 g/dL (ref 6.5–8.1)

## 2015-08-14 LAB — URINALYSIS, ROUTINE W REFLEX MICROSCOPIC
Bilirubin Urine: NEGATIVE
Glucose, UA: NEGATIVE mg/dL
Hgb urine dipstick: NEGATIVE
Ketones, ur: NEGATIVE mg/dL
Nitrite: NEGATIVE
Protein, ur: NEGATIVE mg/dL
Specific Gravity, Urine: 1.014 (ref 1.005–1.030)
Urobilinogen, UA: 1 mg/dL (ref 0.0–1.0)
pH: 6 (ref 5.0–8.0)

## 2015-08-14 LAB — LIPASE, BLOOD: LIPASE: 24 U/L (ref 22–51)

## 2015-08-14 LAB — PREGNANCY, URINE: Preg Test, Ur: NEGATIVE

## 2015-08-14 MED ORDER — ONDANSETRON HCL 4 MG/2ML IJ SOLN
4.0000 mg | Freq: Once | INTRAMUSCULAR | Status: AC
  Administered 2015-08-14: 4 mg via INTRAVENOUS
  Filled 2015-08-14: qty 2

## 2015-08-14 MED ORDER — PROMETHAZINE HCL 25 MG PO TABS: 25.0000 mg | ORAL_TABLET | Freq: Four times a day (QID) | ORAL | Status: DC | PRN

## 2015-08-14 MED ORDER — SODIUM CHLORIDE 0.9 % IV SOLN
Freq: Once | INTRAVENOUS | Status: AC
  Administered 2015-08-14: 21:00:00 via INTRAVENOUS

## 2015-08-14 MED ORDER — KETOROLAC TROMETHAMINE 30 MG/ML IJ SOLN
30.0000 mg | Freq: Once | INTRAMUSCULAR | Status: AC
  Administered 2015-08-14: 30 mg via INTRAVENOUS
  Filled 2015-08-14: qty 1

## 2015-08-14 MED ORDER — METRONIDAZOLE 500 MG PO TABS
500.0000 mg | ORAL_TABLET | Freq: Two times a day (BID) | ORAL | Status: DC
Start: 2015-08-14 — End: 2016-05-04

## 2015-08-14 NOTE — Discharge Instructions (Signed)
You were seen in the ED today for abdominal pain. There does not appear to be an emergent cause for your symptoms at this time. You were, however, found to have Trichomonas in your urine. You will be treated for this STD with antibiotics-metronidazole. Your other abdominal discomfort is also probably due to a virus and will resolve on its own. It is important to stay well hydrated and drink plenty of water or Gatorade. Follow-up with your doctor for reevaluation in 3 days. Return to ED for worsening symptoms  Trichomoniasis Trichomoniasis is an infection caused by an organism called Trichomonas. The infection can affect both women and men. In women, the outer female genitalia and the vagina are affected. In men, the penis is mainly affected, but the prostate and other reproductive organs can also be involved. Trichomoniasis is a sexually transmitted infection (STI) and is most often passed to another person through sexual contact.  RISK FACTORS  Having unprotected sexual intercourse.  Having sexual intercourse with an infected partner. SIGNS AND SYMPTOMS  Symptoms of trichomoniasis in women include:  Abnormal gray-green frothy vaginal discharge.  Itching and irritation of the vagina.  Itching and irritation of the area outside the vagina. Symptoms of trichomoniasis in men include:   Penile discharge with or without pain.  Pain during urination. This results from inflammation of the urethra. DIAGNOSIS  Trichomoniasis may be found during a Pap test or physical exam. Your health care provider may use one of the following methods to help diagnose this infection:  Testing the pH of the vagina with a test tape.  Using a vaginal swab test that checks for the Trichomonas organism. A test is available that provides results within a few minutes.  Examining a urine sample.  Testing vaginal secretions. Your health care provider may test you for other STIs, including HIV. TREATMENT   You may be  given medicine to fight the infection. Women should inform their health care provider if they could be or are pregnant. Some medicines used to treat the infection should not be taken during pregnancy.  Your health care provider may recommend over-the-counter medicines or creams to decrease itching or irritation.  Your sexual partner will need to be treated if infected.  Your health care provider may test you for infection again 3 months after treatment. HOME CARE INSTRUCTIONS   Take medicines only as directed by your health care provider.  Take over-the-counter medicine for itching or irritation as directed by your health care provider.  Do not have sexual intercourse while you have the infection.  Women should not douche or wear tampons while they have the infection.  Discuss your infection with your partner. Your partner may have gotten the infection from you, or you may have gotten it from your partner.  Have your sex partner get examined and treated if necessary.  Practice safe, informed, and protected sex.  See your health care provider for other STI testing. SEEK MEDICAL CARE IF:   You still have symptoms after you finish your medicine.  You develop abdominal pain.  You have pain when you urinate.  You have bleeding after sexual intercourse.  You develop a rash.  Your medicine makes you sick or makes you throw up (vomit). MAKE SURE YOU:  Understand these instructions.  Will watch your condition.  Will get help right away if you are not doing well or get worse.   This information is not intended to replace advice given to you by your health care provider.  Make sure you discuss any questions you have with your health care provider.   Document Released: 04/17/2001 Document Revised: 11/12/2014 Document Reviewed: 08/03/2013 Elsevier Interactive Patient Education [employer]

## 2015-08-14 NOTE — ED Provider Notes (Signed)
CSN: [id number]     Arrival date & time 08/14/15  2043 History  By signing my name below, I, Helane Gunther, attest that this documentation has been prepared under the direction and in the presence of [employer], PA-C. Electronically Signed: Helane Gunther, ED Scribe. 08/14/2015. 9:24 PM.    Chief Complaint  Patient presents with  . Abdominal Pain   The history is provided by the patient. No language interpreter was used.   HPI Comments: Chelsea Harper is a 34 y.o. female who presents to the Emergency Department complaining of constant, worsening, stabbing, lower abdominal pain onset 2 days ago. She reports associated lower back pain, nausea, vomiting (1x), diarrhea, dizziness, and lightheadedness. She has taken ibuprofen with mild relief. She notes she was seen at [facility] 2 months ago for the same where she received pain medication and a CAT scan, which came back negative. She states her LNMP ended on 10/4. She states she gave birth 5 months ago, but denies any PSHx. Pt denies bloody stool, vaginal bleeding or discharge, pelvic pain, dysuria, hematuria, and fever.  Past Medical History  Diagnosis Date  . Gestational diabetes   . History of gestational diabetes 10/05/2014     Clinic [facility] Prenatal Labs  Dating LMP Blood type: O/POS/-- (12/01 1436)  Genetic Screen  Quad:           Normal 10/14/2014 Antibody:NEG (12/01 1436)  Anatomic [place]  1/8 nml with limited views of heart => [ ]  f/u Rubella: 0.91 (12/01 1436)  GTT Early:153.  3hr: 88, 149, 157, 117    Third trimester: 106, 180, 178, 161 RPR: NON REAC (02/04 1718)   TDaP vaccine April 2016 HBsAg: NEGATIVE (12/01 143  . History of gestational hypertension 02/28/2015   Past Surgical History  Procedure Laterality Date  . No past surgeries     Family History  Problem Relation Age of Onset  . Diabetes Mother   . Heart disease Mother   . Cancer Mother     breast  . Hypertension Brother    Social History  Substance Use  Topics  . Smoking status: Never Smoker   . Smokeless tobacco: Never Used  . Alcohol Use: No   OB History    Gravida Para Term Preterm AB TAB SAB Ectopic Multiple Living   1 1 1       0 1     Review of Systems  Constitutional: Negative for fever.  Gastrointestinal: Positive for nausea, vomiting, abdominal pain and diarrhea. Negative for blood in stool.  Genitourinary: Negative for dysuria, hematuria, vaginal bleeding, vaginal discharge and pelvic pain.  Musculoskeletal: Positive for back pain.  Neurological: Positive for dizziness and light-headedness.  All other systems reviewed and are negative.   Allergies  Review of patient's allergies indicates no known allergies.  Home Medications   Prior to Admission medications   Medication Sig Start Date End Date Taking? Authorizing Provider  ACCU-CHEK FASTCLIX LANCETS MISC Inject 1 each into the skin 4 (four) times daily. O24.419 for testing 4 times daily Patient not taking: Reported on 04/15/2015 02/07/15   Donnamae Jude, MD  acetaminophen (TYLENOL) 325 MG tablet Take 650 mg by mouth every 6 (six) hours as needed for moderate pain (Used for abdominal pain.).    Historical Provider, MD  Blood Glucose Monitoring Suppl (ACCU-CHEK NANO SMARTVIEW) W/DEVICE KIT 1 each by Does not apply route 4 (four) times daily. New DX GDM O24.419 for testing 4 times per day Patient not taking: Reported  on 04/15/2015 01/24/15   Woodroe Mode, MD  glucose blood (ACCU-CHEK SMARTVIEW) test strip DX Gestational Diabetes O24.419  for testing 4 times daily Patient not taking: Reported on 04/15/2015 01/24/15   Woodroe Mode, MD  ibuprofen (ADVIL,MOTRIN) 600 MG tablet Take 1 tablet (600 mg total) by mouth every 6 (six) hours. 03/04/15   Seabron Spates, CNM  metroNIDAZOLE (FLAGYL) 500 MG tablet Take 1 tablet (500 mg total) by mouth 2 (two) times daily. 08/14/15   Comer Locket, PA-C  norgestimate-ethinyl estradiol (ORTHO-CYCLEN,SPRINTEC,PREVIFEM) 0.25-35 MG-MCG tablet  Take 1 tablet by mouth daily. 04/15/15   Osborne Oman, MD  prenatal vitamin w/FE, FA (PRENATAL 1 + 1) 27-1 MG TABS tablet Take 1 tablet by mouth daily at 12 noon.    Historical Provider, MD  promethazine (PHENERGAN) 25 MG tablet Take 1 tablet (25 mg total) by mouth every 6 (six) hours as needed for nausea or vomiting. 08/14/15   Shalene Gallen, PA-C   BP 118/60 mmHg  Pulse 94  Temp(Src) 98.1 F (36.7 C) (Oral)  Resp 18  Ht 5' 6.5" (1.689 m)  Wt 289 lb (131.09 kg)  BMI 45.95 kg/m2  SpO2 97%  LMP 06/09/2015 (Approximate) Physical Exam  Constitutional: She is oriented to person, place, and time. She appears well-developed and well-nourished.  Obese African-American female  HENT:  Head: Normocephalic and atraumatic.  Mouth/Throat: Oropharynx is clear and moist.  Eyes: Conjunctivae and EOM are normal. Pupils are equal, round, and reactive to light. Right eye exhibits no discharge. Left eye exhibits no discharge. No scleral icterus.  Neck: Normal range of motion. Neck supple.  Cardiovascular: Normal rate, regular rhythm and normal heart sounds.   Pulmonary/Chest: Effort normal and breath sounds normal. No respiratory distress. She has no wheezes. She has no rales.  Abdominal: Soft.  Diffuse abdominal tenderness throughout abdomen without any focal tenderness. Abdomen is soft and nondistended. However there is a large panniculus. No rebound or guarding. No peritoneal signs. Specifically, negative McBurney's.  Musculoskeletal: Normal range of motion. She exhibits no tenderness.  Neurological: She is alert and oriented to person, place, and time.  Cranial Nerves II-XII grossly intact  Skin: Skin is warm and dry. No rash noted.  Psychiatric: She has a normal mood and affect.  Nursing note and vitals reviewed.   ED Course  Procedures  DIAGNOSTIC STUDIES: Oxygen Saturation is 100% on RA, normal by my interpretation.    COORDINATION OF CARE: 9:23 PM - Discussed probable  gastroenteritis. Discussed plans to order pain medication and diagnostic studies. Pt advised of plan for treatment and pt agrees.  Labs Review Labs Reviewed  URINALYSIS, ROUTINE W REFLEX MICROSCOPIC (NOT AT [facility]) - Abnormal; Notable for the following:    APPearance CLOUDY (*)    Leukocytes, UA SMALL (*)    All other components within normal limits  CBC WITH DIFFERENTIAL/PLATELET - Abnormal; Notable for the following:    WBC 10.7 (*)    Neutro Abs 8.6 (*)    All other components within normal limits  COMPREHENSIVE METABOLIC PANEL - Abnormal; Notable for the following:    Glucose, Bld 110 (*)    Creatinine, Ser 1.22 (*)    GFR calc non Af Amer 58 (*)    All other components within normal limits  URINE MICROSCOPIC-ADD ON - Abnormal; Notable for the following:    Squamous Epithelial / LPF MANY (*)    Bacteria, UA MANY (*)    All other components within normal limits  PREGNANCY,  URINE  LIPASE, BLOOD    Imaging Review No results found. I have personally reviewed and evaluated these images and lab results as part of my medical decision-making.   EKG Interpretation None     Meds given in ED:  Medications  0.9 %  sodium chloride infusion ( Intravenous Stopped 08/14/15 2143)  ondansetron (ZOFRAN) injection 4 mg (4 mg Intravenous Given 08/14/15 2115)  ketorolac (TORADOL) 30 MG/ML injection 30 mg (30 mg Intravenous Given 08/14/15 2148)    Discharge Medication List as of 08/14/2015 10:13 PM    START taking these medications   Details  metroNIDAZOLE (FLAGYL) 500 MG tablet Take 1 tablet (500 mg total) by mouth 2 (two) times daily., Starting 08/14/2015, Until Discontinued, Print       Filed Vitals:   08/14/15 2047 08/14/15 2048 08/14/15 2231 08/14/15 2232  BP:  128/74  118/60  Pulse:  98 89 94  Temp:  98.1 F (36.7 C)    TempSrc:  Oral    Resp:  18    Height: 5' 6.5" (1.689 m)     Weight: 289 lb (131.09 kg)     SpO2:  100% 99% 97%    MDM  Elected to forego pelvic exam and  subsequent STD screening. Vitals stable - WNL -afebrile Pt resting comfortably in ED. PE--patient is obese [place] American female. Diffuse abdominal tenderness without any focal tenderness. Labwork-labs are grossly unremarkable. Leukocytosis at 10.7 and is actually below baseline for patient. No evidence of UTI, however there is Trichomonas in the urine.  DDX--patient presents for evaluation of diffuse abdominal discomfort, nausea and vomiting and diarrhea. Symptoms today most likely related to viral process. Also possible some discomfort arising from Trichomonas infection. We'll treat Trichomonas empirically with metronidazole. Patient elects to forego pelvic exam and subsequent testing. States she will follow-up with her PCP next week for formal pelvic exam. Given prescription for Phenergan at home to help with nausea. I discussed all relevant lab findings and imaging results with pt and they verbalized understanding. Discussed f/u with PCP within 48 hrs and return precautions, pt very amenable to plan.  Final diagnoses:  Abdominal discomfort, generalized  Trichomonas infection   I personally performed the services described in this documentation, which was scribed in my presence. The recorded information has been reviewed and is accurate.   Comer Locket, PA-C 08/14/15 [id number]  Virgel Manifold, MD 08/14/15 [phone]

## 2015-08-14 NOTE — ED Notes (Signed)
Pt c/o lower abd pain, lower back pain, nausea and diarrhea.

## 2015-08-14 NOTE — ED Notes (Signed)
Emesis x1, approximately - yellow fluid.

## 2015-09-11 IMAGING — US US OB DETAIL+14 WK
1 series · 12 of 28 positions shown · non-contrast
Comparison: none

[Series 1: us ob detail+14 wk · 12 of 37 slices shown]
[im 2/37]
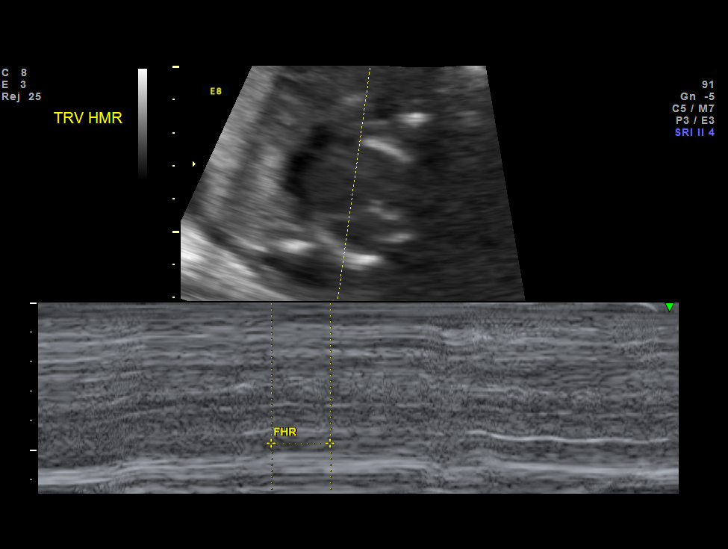
[im 5/37]
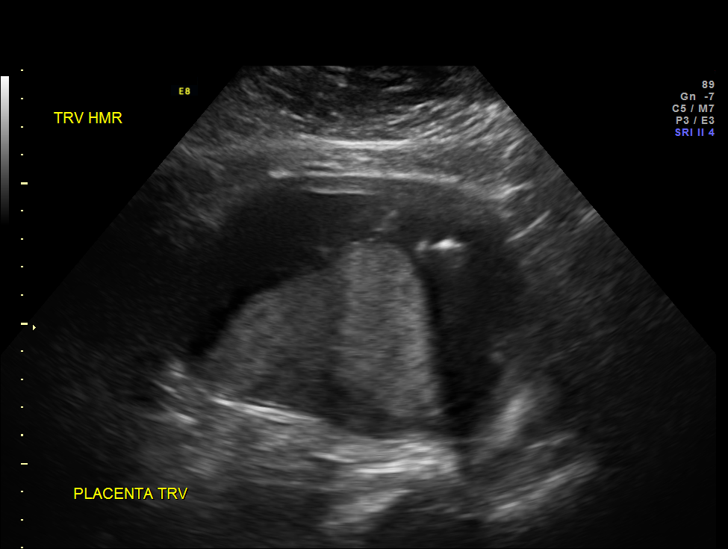
[im 7/37]
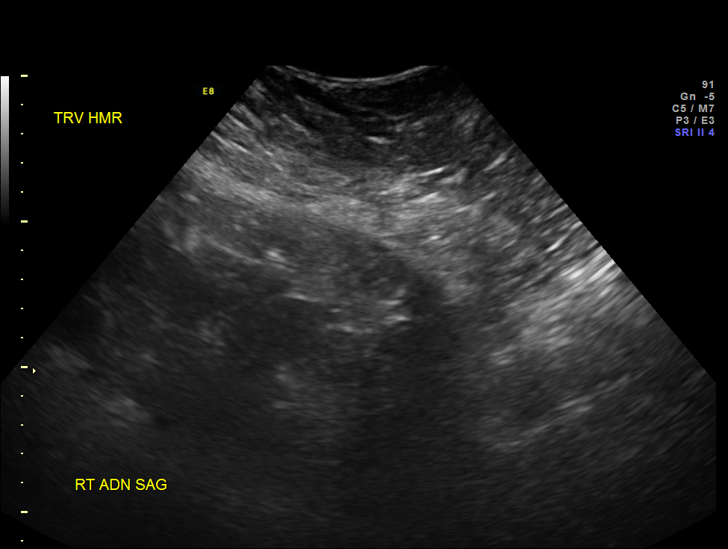
[im 11/37]
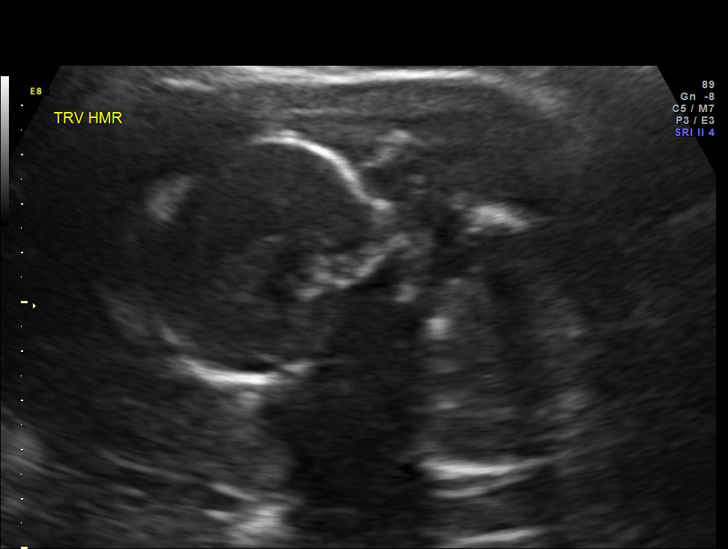
[im 14/37]
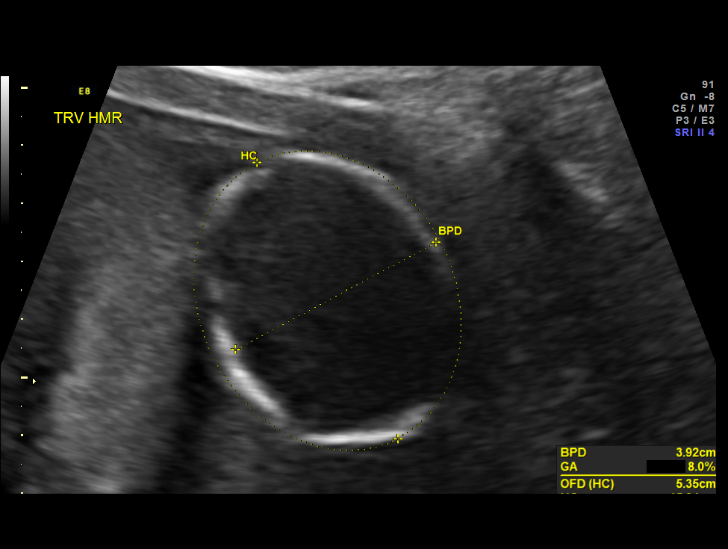
[im 17/37]
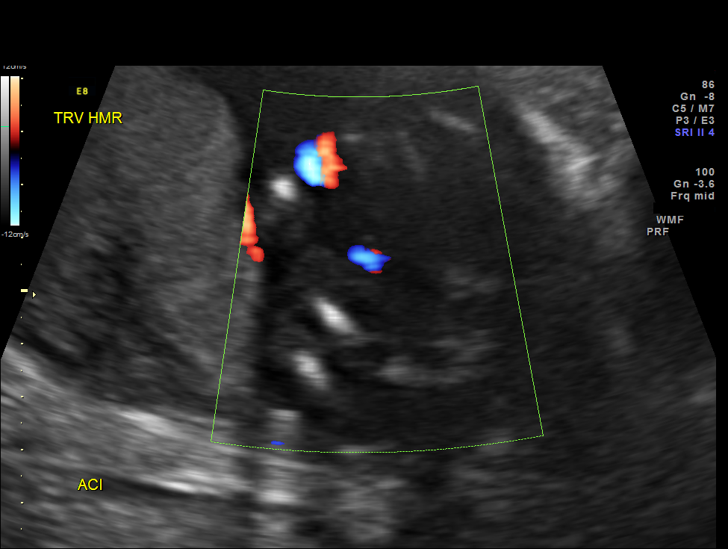
[im 21/37]
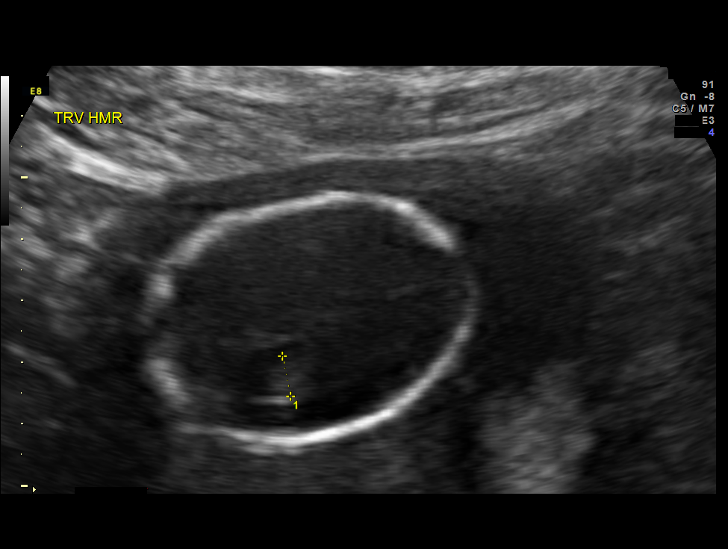
[im 23/37]
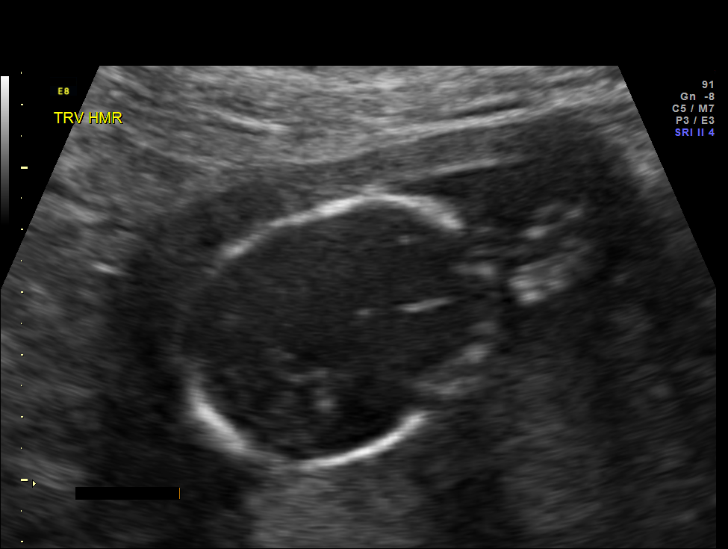
[im 26/37]
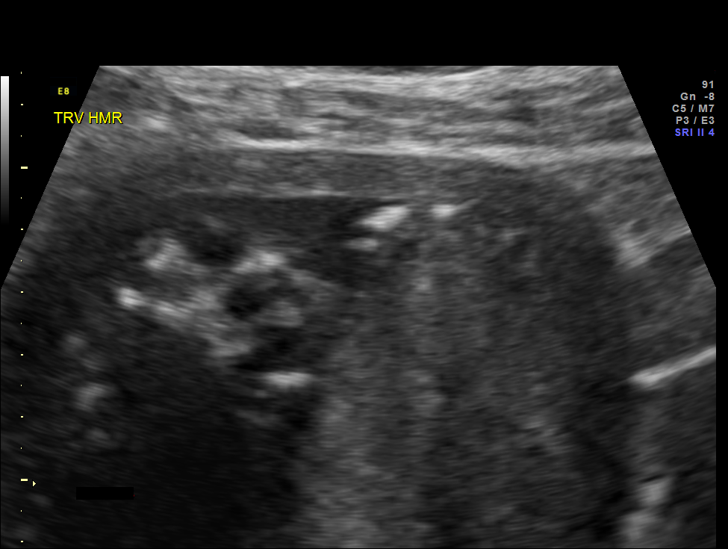
[im 30/37]
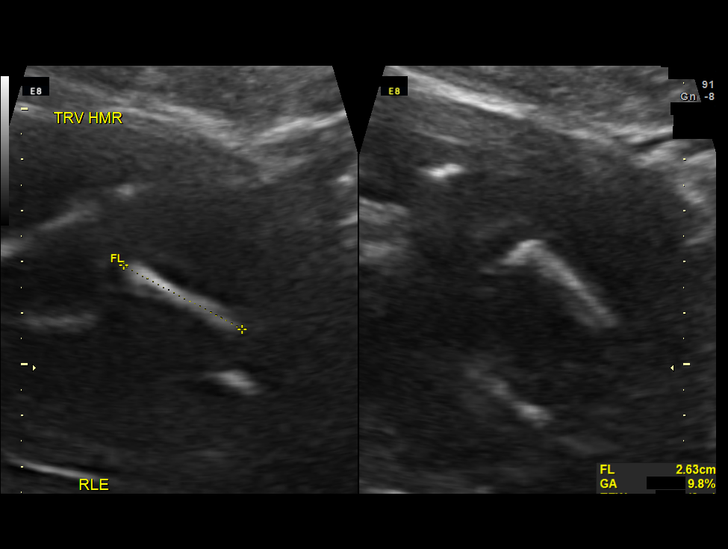
[im 33/37]
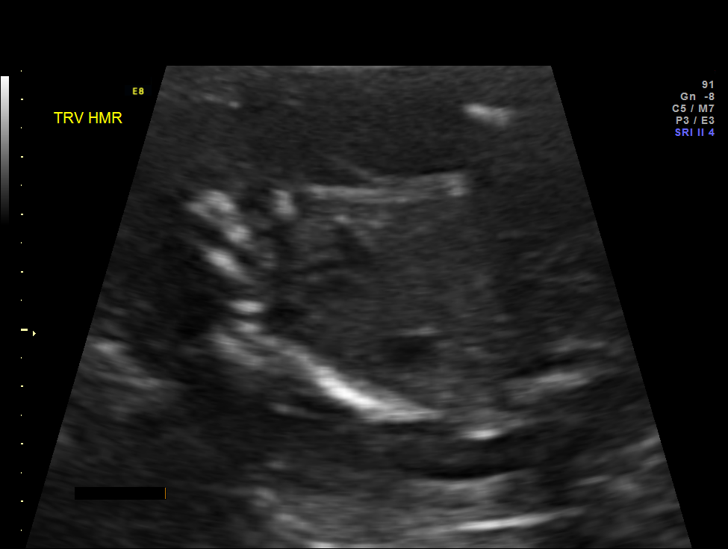
[im 35/37]
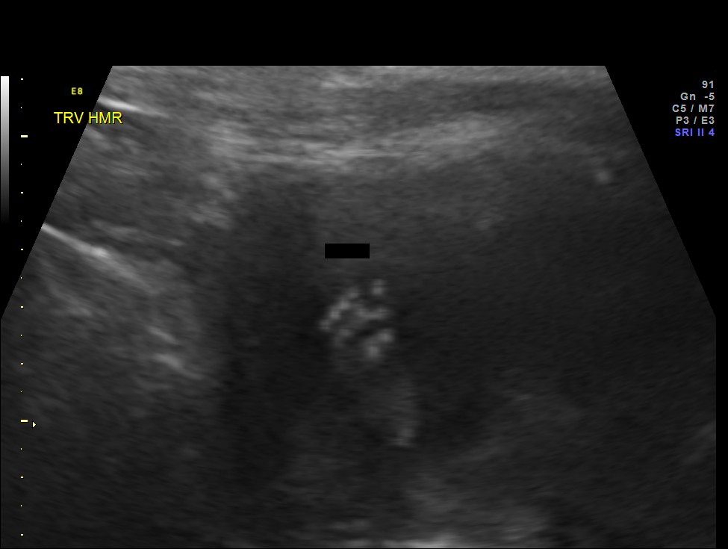

[12 of 28 positions shown; findings below may reference images not displayed]

OBSTETRICS REPORT
                      (Signed Final 10/14/2014 [DATE])

Service(s) Provided

 US OB DETAIL + 14 WK                                  76811.0
Indications

 Detailed fetal anatomic survey                        Z36
 Maternal morbid obesity (316 lb)
 History of genetic / anatomic abnormality - pt with
 sickle cell trait
 19 weeks gestation of pregnancy
Fetal Evaluation

 Num Of Fetuses:    1
 Fetal Heart Rate:  171                          bpm
 Cardiac Activity:  Observed
 Presentation:      Transverse, head to
                    maternal right
 Placenta:          Posterior, above cervical
                    os
 P. Cord            Not well visualized
 Insertion:

 Amniotic Fluid
 AFI FV:      Subjectively low-normal
                                             Larg Pckt:     3.4  cm
Biometry

 BPD:       39  mm     G. Age:  17w 6d                CI:         73.0   70 - 86
 OFD:     53.4  mm                                    FL/HC:      17.9   16.1 -

 HC:     153.5  mm     G. Age:  18w 2d       11  %    HC/AC:      1.10   1.09 -

 AC:       140  mm     G. Age:  19w 3d       54  %    FL/BPD:
 FL:      27.4  mm     G. Age:  18w 3d       19  %    FL/AC:      19.6   20 - 24
 HUM:     26.9  mm     G. Age:  18w 4d       34  %
 CER:     19.6  mm     G. Age:  18w 6d       42  %
 NFT:      3.1  mm

 Est. FW:     259  gm      0 lb 9 oz     39  %
Gestational Age

 LMP:           19w 1d        Date:  06/02/14                 EDD:   03/09/15
 U/S Today:     18w 4d                                        EDD:   03/13/15
 Best:          19w 1d     Det. By:  LMP  (06/02/14)          EDD:   03/09/15
Anatomy

 Cranium:          Appears normal         Diaphragm:        Appears normal
 Fetal Cavum:      Not well visualized    Stomach:          Appears normal, left
                                                            sided
 Ventricles:       Appears normal         Abdomen:          Appears normal
 Choroid Plexus:   Appears normal         Abdominal Wall:   Not well visualized
 Cerebellum:       Appears normal         Cord Vessels:     Not well visualized
 Posterior Fossa:  Appears normal         Kidneys:          Appear normal
 Nuchal Fold:      Appears normal         Bladder:          Appears normal
 Face:             Profile nl; orbits not Spine:            Not well visualized
                   well visualized
 Lips:             Not well visualized    Lower             Visualized
                                          Extremities:
 Heart:            Not well visualized    Upper             Visualized
                                          Extremities:

 Other:  5th digit visualized. Nasal bone visualized. Technically difficult due to
         maternal habitus and fetal position.
Targeted Anatomy

 Fetal Central Nervous System
 Cisterna Magna:
Cervix Uterus Adnexa

 Cervical Length:    4.1      cm

 Cervix:       Normal appearance by transabdominal scan.

 Adnexa:     No abnormality visualized.
Impression

 Single IUP at 19w 1d
 Invalid AFP testing was drawn; will repeat laboratory testing
 today
 Limited views of the fetal anatomy were obtained due to
 maternal habitus and fetal position
 No gross fetal anomalies noted
 Amniotic fluid volume subjectively low normal
Recommendations

 Recommend follow-up ultrasound examination in 4 weeks to
 reevaluate fetal anatomy

## 2016-01-19 ENCOUNTER — Encounter (HOSPITAL_BASED_OUTPATIENT_CLINIC_OR_DEPARTMENT_OTHER): Payer: Self-pay | Admitting: Emergency Medicine

## 2016-01-19 ENCOUNTER — Emergency Department (HOSPITAL_BASED_OUTPATIENT_CLINIC_OR_DEPARTMENT_OTHER)
Admission: EM | Admit: 2016-01-19 | Discharge: 2016-01-19 | Disposition: A | Discharge status: Home / Self Care | Payer: PRIVATE HEALTH INSURANCE | Attending: Physician Assistant | Admitting: Physician Assistant

## 2016-01-19 ENCOUNTER — Emergency Department (HOSPITAL_BASED_OUTPATIENT_CLINIC_OR_DEPARTMENT_OTHER): Payer: PRIVATE HEALTH INSURANCE

## 2016-01-19 DIAGNOSIS — Z793 Long term (current) use of hormonal contraceptives: Secondary | ICD-10-CM | POA: Diagnosis not present

## 2016-01-19 DIAGNOSIS — Z79899 Other long term (current) drug therapy: Secondary | ICD-10-CM | POA: Diagnosis not present

## 2016-01-19 DIAGNOSIS — Z792 Long term (current) use of antibiotics: Secondary | ICD-10-CM | POA: Insufficient documentation

## 2016-01-19 DIAGNOSIS — Z8632 Personal history of gestational diabetes: Secondary | ICD-10-CM | POA: Insufficient documentation

## 2016-01-19 DIAGNOSIS — M79672 Pain in left foot: Secondary | ICD-10-CM | POA: Insufficient documentation

## 2016-01-19 NOTE — ED Provider Notes (Signed)
CSN: [id number]     Arrival date & time 01/19/16  1335 History   First MD Initiated Contact with Patient 01/19/16 1348     Chief Complaint  Patient presents with  . Foot Pain    HPI   Chelsea Harper is a 35 y.o. female with a PMH of gestational DM and gestational HTN who presents to the ED with left foot pain, which she states started 2 days ago and has been intermittent since that time. 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 She reports initially her pain felt like cramping, states her pain now feels aching. She states movement exacerbates her pain. She states she has tried tylenol for her symptoms with no significant relief. She denies numbness, weakness, paresthesia.    Past Medical History  Diagnosis Date  . Gestational diabetes   . History of gestational diabetes 10/05/2014     Clinic [facility] Prenatal Labs  Dating LMP Blood type: O/POS/-- (12/01 1436)  Genetic Screen  Quad:           Normal 10/14/2014 Antibody:NEG (12/01 1436)  Anatomic [place]  1/8 nml with limited views of heart => [ ]  f/u Rubella: 0.91 (12/01 1436)  GTT Early:153.  3hr: 88, 149, 157, 117    Third trimester: 106, 180, 178, 161 RPR: NON REAC (02/04 1718)   TDaP vaccine April 2016 HBsAg: NEGATIVE (12/01 143  . History of gestational hypertension 02/28/2015   Past Surgical History  Procedure Laterality Date  . No past surgeries     Family History  Problem Relation Age of Onset  . Diabetes Mother   . Heart disease Mother   . Cancer Mother     breast  . Hypertension Brother    Social History  Substance Use Topics  . Smoking status: Never Smoker   . Smokeless tobacco: Never Used  . Alcohol Use: No   OB History    Gravida Para Term Preterm AB TAB SAB Ectopic Multiple Living   1 1 1       0 1      Review of Systems  Musculoskeletal: Positive for arthralgias.  Skin: Negative for color change and wound.  Neurological: Negative for weakness and numbness.       Allergies  Review of patient's allergies indicates no known allergies.  Home Medications   Prior to Admission medications   Medication Sig Start Date End Date Taking? Authorizing Provider  ACCU-CHEK FASTCLIX LANCETS MISC Inject 1 each into the skin 4 (four) times daily. O24.419 for testing 4 times daily Patient not taking: Reported on 04/15/2015 02/07/15   Donnamae Jude, MD  acetaminophen (TYLENOL) 325 MG tablet Take 650 mg by mouth every 6 (six) hours as needed for moderate pain (Used for abdominal pain.).    Historical Provider, MD  Blood Glucose Monitoring Suppl (ACCU-CHEK NANO SMARTVIEW) W/DEVICE KIT 1 each by Does not apply route 4 (four) times daily. New DX GDM O24.419 for testing 4 times per day Patient not taking: Reported on 04/15/2015 01/24/15   Woodroe Mode, MD  glucose blood (ACCU-CHEK SMARTVIEW) test strip DX Gestational Diabetes O24.419  for testing 4 times daily Patient not taking: Reported on 04/15/2015 01/24/15   Woodroe Mode, MD  ibuprofen (ADVIL,MOTRIN) 600 MG tablet Take 1 tablet (600 mg total) by mouth every 6 (six) hours. 03/04/15   Seabron Spates, CNM  metroNIDAZOLE (FLAGYL) 500 MG tablet Take 1 tablet (500 mg total)  by mouth 2 (two) times daily. 08/14/15   Comer Locket, PA-C  norgestimate-ethinyl estradiol (ORTHO-CYCLEN,SPRINTEC,PREVIFEM) 0.25-35 MG-MCG tablet Take 1 tablet by mouth daily. 04/15/15   Osborne Oman, MD  prenatal vitamin w/FE, FA (PRENATAL 1 + 1) 27-1 MG TABS tablet Take 1 tablet by mouth daily at 12 noon.    Historical Provider, MD  promethazine (PHENERGAN) 25 MG tablet Take 1 tablet (25 mg total) by mouth every 6 (six) hours as needed for nausea or vomiting. 08/14/15   Benjamin Cartner, PA-C    BP 127/74 mmHg  Pulse 69  Temp(Src) 97.6 F (36.4 C) (Oral)  Resp 16  Ht 5' 6"  (1.676 m)  Wt 124.739 kg  BMI 44.41 kg/m2  SpO2 99%  LMP 01/18/2016 Physical Exam  Constitutional: She is oriented to person, place, and time. She appears  well-developed and well-nourished. No distress.  HENT:  Head: Normocephalic and atraumatic.  Right Ear: External ear normal.  Left Ear: External ear normal.  Nose: Nose normal.  Eyes: Conjunctivae and EOM are normal. Right eye exhibits no discharge. Left eye exhibits no discharge. No scleral icterus.  Neck: Normal range of motion. Neck supple.  Cardiovascular: Normal rate, regular rhythm and intact distal pulses.   Pulmonary/Chest: Effort normal and breath sounds normal. No respiratory distress.  Musculoskeletal: Normal range of motion. She exhibits tenderness. She exhibits no edema.  Mild tenderness to palpation to left lateral foot. No palpable deformity. No erythema, edema, or heat. Full range of motion.  Neurological: She is alert and oriented to person, place, and time. She has normal strength. No sensory deficit.  Skin: Skin is warm and dry. She is not diaphoretic.  Psychiatric: She has a normal mood and affect. Her behavior is normal.  Nursing note and vitals reviewed.   ED Course  Procedures (including critical care time)  Labs Review Labs Reviewed - No data to display  Imaging Review Dg Foot Complete Left  01/19/2016  CLINICAL DATA:  Two day history of cramping type pain EXAM: LEFT FOOT - COMPLETE 3+ VIEW COMPARISON:  None. FINDINGS: Frontal, oblique, and lateral views were obtained. There is mild generalized soft tissue swelling. There is no fracture or dislocation. There is hallux valgus deformity at the first MTP joint. There is no appreciable joint space narrowing. No erosive change. IMPRESSION: Mild generalized soft tissue swelling. Hallux valgus deformity at the first MTP joint. No appreciable joint space narrowing. No fracture or dislocation. Electronically Signed   By: Lowella Grip III M.D.   On: 01/19/2016 14:10   I have personally reviewed and evaluated these images as part of my medical decision-making.   EKG Interpretation None      MDM   Final  diagnoses:  Left foot pain     35 year old female presents with left foot pain. 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 Patient is afebrile. Vital signs stable. On exam, she has mild TTP to her left lateral foot with no palpable deformity, erythema, edema, or heat. Full range of motion of left foot. Patient is neurovascularly intact. Patient given ice for symptoms. Imaging negative for fracture or dislocation. Discussed findings with patient. Will give postop shoe for comfort and advised to rest, ice, and elevate. Recommended she take tylenol or ibuprofen for symptoms. Patient to follow up with orthopedics for persistent pain. Return precautions discussed. Patient verbalizes her understanding and is in agreement with plan.  BP 127/74 mmHg  Pulse 69  Temp(Src) 97.6  F (36.4 C) (Oral)  Resp 16  Ht 5' 6"  (1.676 m)  Wt 124.739 kg  BMI 44.41 kg/m2  SpO2 99%  LMP 01/18/2016     Marella Chimes, PA-C 01/19/16 [place], MD 01/19/16 1507

## 2016-01-19 NOTE — ED Notes (Signed)
Pt is in xray

## 2016-01-19 NOTE — Discharge Instructions (Signed)
1. Medications: tylenol or ibuprofen for pain, usual home medications 2. Treatment: rest, drink plenty of fluids, wear shoe for comfort 3. Follow Up: please followup with orthopedics for persistent symptoms for discussion of your diagnoses and further evaluation after today's visit; if you do not have a primary care doctor use the phone number listed in your discharge paperwork to find one; please return to the ER for increased pain, swelling, numbness, new or worsening symptoms   Musculoskeletal Pain Musculoskeletal pain is muscle and boney aches and pains. These pains can occur in any part of the body. Your caregiver may treat you without knowing the cause of the pain. They may treat you if blood or urine tests, X-rays, and other tests were normal.  CAUSES There is often not a definite cause or reason for these pains. These pains may be caused by a type of germ (virus). The discomfort may also come from overuse. Overuse includes working out too hard when your body is not fit. Boney aches also come from weather changes. Bone is sensitive to atmospheric pressure changes. HOME CARE INSTRUCTIONS   Ask when your test results will be ready. Make sure you get your test results.  Only take over-the-counter or prescription medicines for pain, discomfort, or fever as directed by your caregiver. If you were given medications for your condition, do not drive, operate machinery or power tools, or sign legal documents for 24 hours. Do not drink alcohol. Do not take sleeping pills or other medications that may interfere with treatment.  Continue all activities unless the activities cause more pain. When the pain lessens, slowly resume normal activities. Gradually increase the intensity and duration of the activities or exercise.  During periods of severe pain, bed rest may be helpful. Lay or sit in any position that is comfortable.  Putting ice on the injured area.  Put ice in a bag.  Place a towel between  your skin and the bag.  Leave the ice on for 15 to 20 minutes, 3 to 4 times a day.  Follow up with your caregiver for continued problems and no reason can be found for the pain. If the pain becomes worse or does not go away, it may be necessary to repeat tests or do additional testing. Your caregiver may need to look further for a possible cause. SEEK IMMEDIATE MEDICAL CARE IF:  You have pain that is getting worse and is not relieved by medications.  You develop chest pain that is associated with shortness or breath, sweating, feeling sick to your stomach (nauseous), or throw up (vomit).  Your pain becomes localized to the abdomen.  You develop any new symptoms that seem different or that concern you. MAKE SURE YOU:   Understand these instructions.  Will watch your condition.  Will get help right away if you are not doing well or get worse.   This information is not intended to replace advice given to you by your health care provider. Make sure you discuss any questions you have with your health care provider.   Document Released: 10/22/2005 Document Revised: 01/14/2012 Document Reviewed: 06/26/2013 Elsevier Interactive Patient Education [employer]2016 [employer] Inc.

## 2016-01-19 NOTE — ED Notes (Signed)
Reports left foot pain x1 day. States "it felt like a massive cramp that wont go away. I took some tylenol extra strength." denies trauma or injury

## 2016-03-14 ENCOUNTER — Other Ambulatory Visit: Payer: Self-pay | Admitting: Obstetrics & Gynecology

## 2016-04-11 ENCOUNTER — Other Ambulatory Visit: Payer: Self-pay | Admitting: Obstetrics & Gynecology

## 2016-05-04 ENCOUNTER — Encounter (HOSPITAL_BASED_OUTPATIENT_CLINIC_OR_DEPARTMENT_OTHER): Payer: Self-pay | Admitting: Emergency Medicine

## 2016-05-04 ENCOUNTER — Emergency Department (HOSPITAL_BASED_OUTPATIENT_CLINIC_OR_DEPARTMENT_OTHER)
Admission: EM | Admit: 2016-05-04 | Discharge: 2016-05-04 | Disposition: A | Discharge status: Home / Self Care | Payer: PRIVATE HEALTH INSURANCE | Attending: Emergency Medicine | Admitting: Emergency Medicine

## 2016-05-04 DIAGNOSIS — J329 Chronic sinusitis, unspecified: Secondary | ICD-10-CM | POA: Insufficient documentation

## 2016-05-04 DIAGNOSIS — B9789 Other viral agents as the cause of diseases classified elsewhere: Secondary | ICD-10-CM

## 2016-05-04 DIAGNOSIS — R0981 Nasal congestion: Secondary | ICD-10-CM | POA: Diagnosis present

## 2016-05-04 MED ORDER — NAPROXEN 250 MG PO TABS
500.0000 mg | ORAL_TABLET | Freq: Once | ORAL | Status: AC
  Administered 2016-05-04: 500 mg via ORAL
  Filled 2016-05-04: qty 2

## 2016-05-04 MED ORDER — OXYMETAZOLINE HCL 0.05 % NA SOLN
2.0000 | Freq: Two times a day (BID) | NASAL | Status: DC | PRN
  Administered 2016-05-04: 2 via NASAL
  Filled 2016-05-04: qty 15

## 2016-05-04 MED ORDER — PSEUDOEPHEDRINE-GUAIFENESIN ER 120-1200 MG PO TB12: ORAL_TABLET | ORAL | Status: DC

## 2016-05-04 MED ORDER — NAPROXEN 500 MG PO TABS: 500.0000 mg | ORAL_TABLET | Freq: Two times a day (BID) | ORAL | Status: DC | PRN

## 2016-05-04 NOTE — ED Notes (Signed)
patient states that she is having sinus Congestion and has become more and more congested over the last couple of hours. Patient also reports that she has a cough.

## 2016-05-04 NOTE — ED Notes (Signed)
[facility] pt seen by MD prior to RN assessment, see MD notes, pending orders.

## 2016-05-04 NOTE — ED Provider Notes (Signed)
CSN: [id number]     Arrival date & time 05/04/16  0011 History   First MD Initiated Contact with Patient 05/04/16 [phone]     Chief Complaint  Patient presents with  . 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 She is here with nasal congestion and right-sided sinus pain since about 6 PM yesterday evening. She states her sinus pain is severe. She has been taking Sudafed without adequate relief. She denies fever, chills, nausea, vomiting or diarrhea. She is also having an occasional cough.  Past Medical History  Diagnosis Date  . Gestational diabetes   . History of gestational diabetes 10/05/2014     Clinic [facility] Prenatal Labs  Dating LMP Blood type: O/POS/-- (12/01 1436)  Genetic Screen  Quad:           Normal 10/14/2014 Antibody:NEG (12/01 1436)  Anatomic [place]  1/8 nml with limited views of heart => _0  f/u Rubella: 0.91 (12/01 1436)  GTT Early:153.  3hr: 88, 149, 157, 117    Third trimester: 106, 180, 178, 161 RPR: NON REAC (02/04 1718)   TDaP vaccine April 2016 HBsAg: NEGATIVE (12/01 143  . History of gestational hypertension 02/28/2015   Past Surgical History  Procedure Laterality Date  . No past surgeries     Family History  Problem Relation Age of Onset  . Diabetes Mother   . Heart disease Mother   . Cancer Mother     breast  . Hypertension Brother    Social History  Substance Use Topics  . Smoking status: Never Smoker   . Smokeless tobacco: Never Used  . Alcohol Use: No   OB History    Gravida Para Term Preterm AB TAB SAB Ectopic Multiple Living   _1 0 1     Review of Systems  All other systems reviewed and are negative.   Allergies  Review of patient's allergies indicates no known allergies.  Home Medications   Prior to Admission medications   Medication Sig Start Date End Date Taking? Authorizing Provider  ACCU-CHEK FASTCLIX  LANCETS MISC Inject 1 each into the skin 4 (four) times daily. O24.419 for testing 4 times daily Patient not taking: Reported on 04/15/2015 02/07/15   Donnamae Jude, MD  acetaminophen (TYLENOL) 325 MG tablet Take 650 mg by mouth every 6 (six) hours as needed for moderate pain (Used for abdominal pain.).    Historical Provider, MD  Blood Glucose Monitoring Suppl (ACCU-CHEK NANO SMARTVIEW) W/DEVICE KIT 1 each by Does not apply route 4 (four) times daily. New DX GDM O24.419 for testing 4 times per day Patient not taking: Reported on 04/15/2015 01/24/15   Woodroe Mode, MD  glucose blood (ACCU-CHEK SMARTVIEW) test strip DX Gestational Diabetes O24.419  for testing 4 times daily Patient not taking: Reported on 04/15/2015 01/24/15   Woodroe Mode, MD  ibuprofen (ADVIL,MOTRIN) 600 MG tablet Take 1 tablet (600 mg total) by mouth every 6 (six) hours. 03/04/15   Seabron Spates, CNM  metroNIDAZOLE (FLAGYL) 500 MG tablet Take 1 tablet (500 mg total) by mouth 2 (two) times daily. 08/14/15   Comer Locket, PA-C  prenatal vitamin w/FE, FA (PRENATAL 1 + 1) 27-1 MG TABS tablet Take 1 tablet by mouth daily at 12 noon.    Historical Provider, MD  promethazine (PHENERGAN) 25 MG tablet Take 1 tablet (25 mg total) by  mouth every 6 (six) hours as needed for nausea or vomiting. 08/14/15   Benjamin Cartner, PA-C  SPRINTEC 28 0.25-35 MG-MCG tablet TAKE ONE TABLET BY MOUTH ONCE DAILY 04/11/16   Osborne Oman, MD   BP 116/62 mmHg  Pulse 88  Temp(Src) 97.8 F (36.6 C) (Oral)  Resp 18  Ht _0  (1.676 m)  Wt 288 lb (130.636 kg)  BMI 46.51 kg/m2  SpO2 100%  LMP 04/18/2016   Physical Exam  General: Well-developed, obese female in no acute distress; appearance consistent with age of record HENT: normocephalic; atraumatic; nasal congestion; right sided frontal and maxillary sinus tenderness; pharynx normal Eyes: pupils equal, round and reactive to light; extraocular muscles intact Neck: supple Heart: regular rate and  rhythm Lungs: clear to auscultation bilaterally Abdomen: soft; nondistended Extremities: No deformity; full range of motion Neurologic: Awake, alert and oriented; motor function intact in all extremities and symmetric; no facial droop Skin: Warm and dry Psychiatric: Normal mood and affect    ED Course  Procedures (including critical care time)   MDM     Shanon Rosser, MD 05/04/16 0277

## 2016-05-09 ENCOUNTER — Other Ambulatory Visit: Payer: Self-pay | Admitting: Obstetrics & Gynecology

## 2016-05-12 ENCOUNTER — Emergency Department (HOSPITAL_BASED_OUTPATIENT_CLINIC_OR_DEPARTMENT_OTHER)
Admission: EM | Admit: 2016-05-12 | Discharge: 2016-05-12 | Disposition: A | Discharge status: Home / Self Care | Payer: PRIVATE HEALTH INSURANCE | Attending: Emergency Medicine | Admitting: Emergency Medicine

## 2016-05-12 ENCOUNTER — Emergency Department (HOSPITAL_BASED_OUTPATIENT_CLINIC_OR_DEPARTMENT_OTHER): Payer: PRIVATE HEALTH INSURANCE

## 2016-05-12 ENCOUNTER — Encounter (HOSPITAL_BASED_OUTPATIENT_CLINIC_OR_DEPARTMENT_OTHER): Payer: Self-pay | Admitting: Emergency Medicine

## 2016-05-12 DIAGNOSIS — X501XXA Overexertion from prolonged static or awkward postures, initial encounter: Secondary | ICD-10-CM | POA: Diagnosis not present

## 2016-05-12 DIAGNOSIS — Y92009 Unspecified place in unspecified non-institutional (private) residence as the place of occurrence of the external cause: Secondary | ICD-10-CM | POA: Diagnosis not present

## 2016-05-12 DIAGNOSIS — Y999 Unspecified external cause status: Secondary | ICD-10-CM | POA: Insufficient documentation

## 2016-05-12 DIAGNOSIS — Y939 Activity, unspecified: Secondary | ICD-10-CM | POA: Diagnosis not present

## 2016-05-12 DIAGNOSIS — S93402A Sprain of unspecified ligament of left ankle, initial encounter: Secondary | ICD-10-CM | POA: Insufficient documentation

## 2016-05-12 DIAGNOSIS — S99912A Unspecified injury of left ankle, initial encounter: Secondary | ICD-10-CM | POA: Diagnosis present

## 2016-05-12 MED ORDER — NAPROXEN 250 MG PO TABS
500.0000 mg | ORAL_TABLET | Freq: Once | ORAL | Status: AC
  Administered 2016-05-12: 500 mg via ORAL
  Filled 2016-05-12: qty 2

## 2016-05-12 MED ORDER — NAPROXEN 500 MG PO TABS: 500.0000 mg | ORAL_TABLET | Freq: Two times a day (BID) | ORAL | Status: DC | PRN

## 2016-05-12 NOTE — ED Notes (Signed)
CMS intact before and after. Pt tolerated well. Pt had no questions.  

## 2016-05-12 NOTE — ED Notes (Signed)
Patient transported to X-ray 

## 2016-05-12 NOTE — ED Notes (Signed)
Pt injured left ankle on July 4th, no bruising or swelling appreciated, pt ambulated into department without apparent difficulty.

## 2016-05-12 NOTE — ED Notes (Signed)
Pt verbalizes understanding of dc instructions.  Asking for "some pain meds."  Informed pt that the Naproxen prescription is for pain.  Pt also asking "so how long you going to write me out of work."  Pt does not have any restrictions, is ambulating without difficulty in flip flops, informed pt that nurse would give her a work note for the weekend, but that she is capable of working should she choose to do so.

## 2016-05-12 NOTE — ED Provider Notes (Signed)
CSN: [id number]     Arrival date & time 05/12/16  0023 History   First MD Initiated Contact with Patient 05/12/16 0040     Chief Complaint  Patient presents with  . Ankle Injury     (Consider location/radiation/quality/duration/timing/severity/associated sxs/prior Treatment) HPI  This is a 35 year old female who twisted her left ankle 4 days ago when she fell at home. She continues to have moderate pain in her left medial ankle, worse with weightbearing or ambulation. She has been applying an Ace wrap and soaking it without complete relief of her discomfort. There is mild associated swelling. She has taken only Tylenol to treat it.  Past Medical History  Diagnosis Date  . Gestational diabetes   . History of gestational diabetes 10/05/2014     Clinic [facility] Prenatal Labs  Dating LMP Blood type: O/POS/-- (12/01 1436)  Genetic Screen  Quad:           Normal 10/14/2014 Antibody:NEG (12/01 1436)  Anatomic [place]  1/8 nml with limited views of heart =>  f/u Rubella: 0.91 (12/01 1436)  GTT Early:153.  3hr: 88, 149, 157, 117    Third trimester: 106, 180, 178, 161 RPR: NON REAC (02/04 1718)   TDaP vaccine April 2016 HBsAg: NEGATIVE (12/01 143  . History of gestational hypertension 02/28/2015   Past Surgical History  Procedure Laterality Date  . No past surgeries     Family History  Problem Relation Age of Onset  . Diabetes Mother   . Heart disease Mother   . Cancer Mother     breast  . Hypertension Brother    Social History  Substance Use Topics  . Smoking status: Never Smoker   . Smokeless tobacco: Never Used  . Alcohol Use: No   OB History    Gravida Para Term Preterm AB TAB SAB Ectopic Multiple Living   0 1     Review of Systems  All other systems reviewed and are negative.   Allergies  Review of patient's allergies indicates no known allergies.  Home Medications   Prior to Admission medications   Medication Sig Start Date End Date Taking? Authorizing Provider   naproxen (NAPROSYN) 500 MG tablet Take 1 tablet (500 mg total) by mouth 2 (two) times daily as needed (headache or sinus pain). 05/04/16   Paula Libra, MD  Pseudoephedrine-Guaifenesin [phone] MG TB12 Take one tablet every 12 hours as needed for nasal congestion or cough. 05/04/16   Chaley Castellanos, MD  SPRINTEC 28 0.25-35 MG-MCG tablet TAKE ONE TABLET BY MOUTH ONCE DAILY 05/09/16   Vela Prose A Anyanwu, MD   BP 132/86 mmHg  Pulse 86  Temp(Src) 97.8 F (36.6 C)  Resp 18  Wt 288 lb (130.636 kg)  SpO2 98%  LMP 05/11/2016   Physical Exam  General: Well-developed, obese female in no acute distress; appearance consistent with age of record HENT: normocephalic; atraumatic Eyes: Normal appearance Neck: supple Heart: regular rate and rhythm Lungs: Normal respiratory effort and excursion Abdomen: soft; nondistended Extremities: No deformity; full range of motion; pulses normal; mild tenderness of left medial ankle with mild edema Neurologic: Awake, alert and oriented; motor function intact in all extremities and symmetric; no facial droop Skin: Warm and dry Psychiatric: Normal mood and affect    ED Course  Procedures (including critical care time)   MDM  Nursing notes and vitals signs, including pulse oximetry, reviewed.  Summary of this visit's results, reviewed by myself:  Imaging Studies: Dg Ankle Complete Left  05/12/2016  CLINICAL DATA:  Twisted ankle 4 days ago, medial ankle pain. EXAM: LEFT ANKLE COMPLETE - 3+ VIEW COMPARISON:  LEFT foot radiograph January 19, 2016 FINDINGS: No fracture deformity nor dislocation. The ankle mortise appears congruent and the tibiofibular syndesmosis intact. No destructive bony lesions. Soft tissue swelling without subcutaneous gas radiopaque foreign bodies. IMPRESSION: Soft tissue swelling without acute fracture deformity or dislocation. Electronically Signed   By: Awilda Metroourtnay  Bloomer M.D.   On: 05/12/2016 01:44       Paula LibraJohn Selwyn Reason, MD 05/12/16 [phone]

## 2016-05-12 NOTE — ED Notes (Signed)
Pt reports injuried ankle attempting to avoid falling on her child she injuried  Left ankle. Ace wrap and soaking having minimal relief of her pain

## 2016-05-16 ENCOUNTER — Other Ambulatory Visit: Payer: Self-pay | Admitting: General Practice

## 2016-05-16 DIAGNOSIS — Z3041 Encounter for surveillance of contraceptive pills: Secondary | ICD-10-CM

## 2016-05-16 MED ORDER — NORGESTIMATE-ETH ESTRADIOL 0.25-35 MG-MCG PO TABS: 1.0000 | ORAL_TABLET | Freq: Every day | ORAL | Status: AC

## 2016-06-21 ENCOUNTER — Emergency Department (HOSPITAL_BASED_OUTPATIENT_CLINIC_OR_DEPARTMENT_OTHER)
Admission: EM | Admit: 2016-06-21 | Discharge: 2016-06-22 | Disposition: A | Discharge status: Home / Self Care | Payer: PRIVATE HEALTH INSURANCE | Attending: Emergency Medicine | Admitting: Emergency Medicine

## 2016-06-21 ENCOUNTER — Encounter (HOSPITAL_BASED_OUTPATIENT_CLINIC_OR_DEPARTMENT_OTHER): Payer: Self-pay | Admitting: *Deleted

## 2016-06-21 DIAGNOSIS — M940 Chondrocostal junction syndrome [Tietze]: Secondary | ICD-10-CM

## 2016-06-21 DIAGNOSIS — R072 Precordial pain: Secondary | ICD-10-CM | POA: Diagnosis present

## 2016-06-21 NOTE — ED Triage Notes (Addendum)
_Pt c/o mid sternal chest wall  pain denies SOb n/v . Increased pain with movt.  X 1 day

## 2016-06-22 MED ORDER — NAPROXEN 500 MG PO TABS: 500.0000 mg | ORAL_TABLET | Freq: Two times a day (BID) | ORAL | 0 refills | Status: DC | PRN

## 2016-06-22 MED ORDER — NAPROXEN 250 MG PO TABS
500.0000 mg | ORAL_TABLET | Freq: Once | ORAL | Status: AC
  Administered 2016-06-22: 500 mg via ORAL
  Filled 2016-06-22: qty 2

## 2016-06-22 NOTE — ED Provider Notes (Signed)
[facility] Provider Note   CSN: [id number] Arrival date & time: 06/21/16  2108     History   Chief Complaint Chief Complaint  Patient presents with  . Chest Pain    HPI Chelsea Harper is a 35 y.o. female who developed left parasternal pain approximately 5 PM yesterday evening. The onset was gradual. The pain is now moderate and described as dull or crampy. It is worse with movement to the left, to the right, forward, backwards and with deep breathing, coughing or sneezing. She denies shortness of breath, fever, chills, nausea, vomiting or diarrhea. 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   HPI  Past Medical History:  Diagnosis Date  . Gestational diabetes   . History of gestational diabetes 10/05/2014    Clinic [facility] Prenatal Labs  Dating LMP Blood type: O/POS/-- (12/01 1436)  Genetic Screen  Quad:           Normal 10/14/2014 Antibody:NEG (12/01 1436)  Anatomic US  1/8 nml with limited views of heart => [ ]  f/u Rubella: 0.91 (12/01 1436)  GTT Early:153.  3hr: 88, 149, 157, 117    Third trimester: 106, 180, 178, 161 RPR: NON REAC (02/04 1718)   TDaP vaccine April 2016 HBsAg: NEGATIVE (12/01 143  . History of gestational hypertension 02/28/2015    Patient Active Problem List   Diagnosis Date Noted  . History of gestational hypertension 02/28/2015  . AS (sickle cell trait) (HCC) 11/11/2014  . History of gestational diabetes 10/05/2014  . Depression, recurrent (HCC) 08/06/2014    Past Surgical History:  Procedure Laterality Date  . NO PAST SURGERIES      OB History    Gravida Para Term Preterm AB Living   1 1 1     1    SAB TAB Ectopic Multiple Live Births         0 1       Home Medications    Prior to Admission medications   Medication Sig Start Date End Date Taking? Authorizing Provider  norgestimate-ethinyl estradiol (ORTHO-CYCLEN,SPRINTEC,PREVIFEM) 0.25-35 MG-MCG tablet Take 1 tablet by mouth daily. 05/16/16   Aviva SignsMarie L Williams,  CNM    Family History Family History  Problem Relation Age of Onset  . Diabetes Mother   . Heart disease Mother   . Cancer Mother     breast  . Hypertension Brother     Social History Social History  Substance Use Topics  . Smoking status: Never Smoker  . Smokeless tobacco: Never Used  . Alcohol use No     Allergies   Review of patient's allergies indicates no known allergies.   Review of Systems Review of Systems  All other systems reviewed and are negative.    Physical Exam Updated Vital Signs BP 124/77 (BP Location: Right Arm)   Pulse 87   Temp 98.8 F (37.1 C) (Oral)   Resp 16   Ht 5\' 6"  (1.676 m)   Wt 290 lb (131.5 kg)   LMP 06/08/2016   SpO2 100%   BMI 46.81 kg/m   Physical Exam General: Well-developed, well-nourished female in no acute distress; appearance consistent with age of record HENT: normocephalic; atraumatic Eyes: pupils equal, round and reactive to light; extraocular muscles intact Neck: supple Heart: regular rate and rhythm; no murmurs, rubs or gallops Lungs: clear to auscultation bilaterally Chest: Left parasternal rib tenderness Abdomen: soft; nondistended; nontender; bowel sounds present Extremities: No deformity; full range of motion; pulses normal  Neurologic: Awake, alert and oriented; motor function intact in all extremities and symmetric; no facial droop Skin: Warm and dry Psychiatric: Normal mood and affect    ED Treatments / Results    EKG Interpretation  Date/Time:  Friday June 22 2016 00:11:45 EDT Ventricular Rate:  63 PR Interval:    QRS Duration: 88 QT Interval:  401 QTC Calculation: 411 R Axis:   76 Text Interpretation:  Sinus rhythm Borderline prolonged PR interval No previous ECGs available Confirmed by Read DriversMOLPUS  MD, Jonny RuizJOHN ([id number]) on 06/22/2016 12:53:02 AM      Procedures (including critical care time)   Final Clinical Impressions(s) / ED Diagnoses   Final diagnoses:  Costochondritis      Paula LibraJohn  Charisa Twitty, MD 06/22/16 [phone]

## 2017-01-09 ENCOUNTER — Emergency Department (HOSPITAL_BASED_OUTPATIENT_CLINIC_OR_DEPARTMENT_OTHER)
Admission: EM | Admit: 2017-01-09 | Discharge: 2017-01-09 | Disposition: A | Discharge status: Home / Self Care | Payer: PRIVATE HEALTH INSURANCE | Attending: Emergency Medicine | Admitting: Emergency Medicine

## 2017-01-09 ENCOUNTER — Encounter (HOSPITAL_BASED_OUTPATIENT_CLINIC_OR_DEPARTMENT_OTHER): Payer: Self-pay | Admitting: Emergency Medicine

## 2017-01-09 DIAGNOSIS — R197 Diarrhea, unspecified: Secondary | ICD-10-CM | POA: Insufficient documentation

## 2017-01-09 DIAGNOSIS — R112 Nausea with vomiting, unspecified: Secondary | ICD-10-CM | POA: Diagnosis present

## 2017-01-09 LAB — CBC WITH DIFFERENTIAL/PLATELET
BASOS ABS: 0 10*3/uL (ref 0.0–0.1)
BASOS PCT: 0 %
EOS PCT: 0 %
Eosinophils Absolute: 0 10*3/uL (ref 0.0–0.7)
HEMATOCRIT: 35.9 % — AB (ref 36.0–46.0)
Hemoglobin: 11.9 g/dL — ABNORMAL LOW (ref 12.0–15.0)
Lymphocytes Relative: 9 %
Lymphs Abs: 0.9 10*3/uL (ref 0.7–4.0)
MCH: 27.4 pg (ref 26.0–34.0)
MCHC: 33.1 g/dL (ref 30.0–36.0)
MCV: 82.7 fL (ref 78.0–100.0)
MONO ABS: 0.3 10*3/uL (ref 0.1–1.0)
MONOS PCT: 3 %
Neutro Abs: 8.5 10*3/uL — ABNORMAL HIGH (ref 1.7–7.7)
Neutrophils Relative %: 88 %
PLATELETS: 316 10*3/uL (ref 150–400)
RBC: 4.34 MIL/uL (ref 3.87–5.11)
RDW: 14.2 % (ref 11.5–15.5)
WBC: 9.7 10*3/uL (ref 4.0–10.5)

## 2017-01-09 LAB — URINALYSIS, MICROSCOPIC (REFLEX): RBC / HPF: NONE SEEN RBC/hpf (ref 0–5)

## 2017-01-09 LAB — COMPREHENSIVE METABOLIC PANEL
ALBUMIN: 3.6 g/dL (ref 3.5–5.0)
ALT: 17 U/L (ref 14–54)
ANION GAP: 7 (ref 5–15)
AST: 20 U/L (ref 15–41)
Alkaline Phosphatase: 86 U/L (ref 38–126)
BILIRUBIN TOTAL: 0.6 mg/dL (ref 0.3–1.2)
BUN: 11 mg/dL (ref 6–20)
CHLORIDE: 103 mmol/L (ref 101–111)
CO2: 26 mmol/L (ref 22–32)
Calcium: 8.7 mg/dL — ABNORMAL LOW (ref 8.9–10.3)
Creatinine, Ser: 1.08 mg/dL — ABNORMAL HIGH (ref 0.44–1.00)
GFR calc Af Amer: >60 mL/min (ref >60)
GLUCOSE: 113 mg/dL — AB (ref 65–99)
Potassium: 4.1 mmol/L (ref 3.5–5.1)
Sodium: 136 mmol/L (ref 135–145)
TOTAL PROTEIN: 7.6 g/dL (ref 6.5–8.1)

## 2017-01-09 LAB — PREGNANCY, URINE: PREG TEST UR: NEGATIVE

## 2017-01-09 LAB — URINALYSIS, ROUTINE W REFLEX MICROSCOPIC
Bilirubin Urine: NEGATIVE
Glucose, UA: NEGATIVE mg/dL
Hgb urine dipstick: NEGATIVE
KETONES UR: NEGATIVE mg/dL
NITRITE: NEGATIVE
PROTEIN: NEGATIVE mg/dL
Specific Gravity, Urine: 1.013 (ref 1.005–1.030)
pH: 7.5 (ref 5.0–8.0)

## 2017-01-09 LAB — LIPASE, BLOOD: LIPASE: 18 U/L (ref 11–51)

## 2017-01-09 MED ORDER — SODIUM CHLORIDE 0.9 % IV BOLUS (SEPSIS)
1000.0000 mL | Freq: Once | INTRAVENOUS | Status: AC
  Administered 2017-01-09: 1000 mL via INTRAVENOUS

## 2017-01-09 MED ORDER — DICYCLOMINE HCL 20 MG PO TABS: 20.0000 mg | ORAL_TABLET | Freq: Three times a day (TID) | ORAL | 0 refills | Status: DC | PRN

## 2017-01-09 MED ORDER — ONDANSETRON HCL 4 MG/2ML IJ SOLN
4.0000 mg | Freq: Once | INTRAMUSCULAR | Status: AC
  Administered 2017-01-09: 4 mg via INTRAVENOUS
  Filled 2017-01-09: qty 2

## 2017-01-09 MED ORDER — ONDANSETRON 4 MG PO TBDP: 4.0000 mg | ORAL_TABLET | Freq: Three times a day (TID) | ORAL | 0 refills | Status: AC | PRN

## 2017-01-09 MED ORDER — ACETAMINOPHEN 325 MG PO TABS
650.0000 mg | ORAL_TABLET | Freq: Once | ORAL | Status: AC
  Administered 2017-01-09: 650 mg via ORAL
  Filled 2017-01-09: qty 2

## 2017-01-09 MED ORDER — DICYCLOMINE HCL 10 MG/ML IM SOLN
20.0000 mg | Freq: Once | INTRAMUSCULAR | Status: AC
  Administered 2017-01-09: 20 mg via INTRAMUSCULAR
  Filled 2017-01-09: qty 2

## 2017-01-09 MED ORDER — KETOROLAC TROMETHAMINE 30 MG/ML IJ SOLN
30.0000 mg | Freq: Once | INTRAMUSCULAR | Status: AC
  Administered 2017-01-09: 30 mg via INTRAVENOUS
  Filled 2017-01-09: qty 1

## 2017-01-09 MED ORDER — LOPERAMIDE HCL 2 MG PO CAPS
2.0000 mg | ORAL_CAPSULE | Freq: Once | ORAL | Status: AC
  Administered 2017-01-09: 2 mg via ORAL
  Filled 2017-01-09: qty 1

## 2017-01-09 NOTE — ED Notes (Signed)
ED Provider at bedside. 

## 2017-01-09 NOTE — ED Triage Notes (Addendum)
Pt reports mid abd pain with vomiting x 2 episodes today. Pt also reports chills and body aches all over.

## 2017-01-09 NOTE — ED Provider Notes (Signed)
TIME SEEN: 12:55 AM  CHIEF COMPLAINT: Nausea, vomiting, diarrhea, diffuse abdominal cramping  HPI: Patient is a 36 year old female with no significant past medical history who presents to the emergency department with one day of diffuse sharp abdominal pain, cramping pain. Has had 2 episodes of vomiting and 2 episodes of diarrhea. Has had chills but no documented fever. History of abdominal surgeries, recent international travel, sick contacts or bad food exposure. Denies dysuria, hematuria, vaginal bleeding or discharge. Last menstrual period was the 15th. She works at [employer].  ROS: See HPI Constitutional: no fever  Eyes: no drainage  ENT: no runny nose   Cardiovascular:  no chest pain  Resp: no SOB  GI: Diarrhea and vomiting GU: no dysuria Integumentary: no rash  Allergy: no hives  Musculoskeletal: no leg swelling  Neurological: no slurred speech ROS otherwise negative  PAST MEDICAL HISTORY/PAST SURGICAL HISTORY:  Past Medical History:  Diagnosis Date  . Gestational diabetes   . History of gestational diabetes 10/05/2014    Clinic [facility] Prenatal Labs  Dating LMP Blood type: O/POS/-- (12/01 1436)  Genetic Screen  Quad:           Normal 10/14/2014 Antibody:NEG (12/01 1436)  Anatomic US  1/8 nml with limited views of heart => [ ]  f/u Rubella: 0.91 (12/01 1436)  GTT Early:153.  3hr: 88, 149, 157, 117    Third trimester: 106, 180, 178, 161 RPR: NON REAC (02/04 1718)   TDaP vaccine April 2016 HBsAg: NEGATIVE (12/01 143  . History of gestational hypertension 02/28/2015    MEDICATIONS:  Prior to Admission medications   Medication Sig Start Date End Date Taking? Authorizing Provider  naproxen (NAPROSYN) 500 MG tablet Take 1 tablet (500 mg total) by mouth 2 (two) times daily as needed (for chest wall pain). 06/22/16   John Molpus, MD  norgestimate-ethinyl estradiol (ORTHO-CYCLEN,SPRINTEC,PREVIFEM) 0.25-35 MG-MCG tablet Take 1 tablet by mouth daily. 05/16/16   Aviva SignsMarie L  Williams, CNM    ALLERGIES:  No Known Allergies  SOCIAL HISTORY:  Social History  Substance Use Topics  . Smoking status: Never Smoker  . Smokeless tobacco: Never Used  . Alcohol use No    FAMILY HISTORY: Family History  Problem Relation Age of Onset  . Diabetes Mother   . Heart disease Mother   . Cancer Mother     breast  . Hypertension Brother     EXAM: BP 159/93 (BP Location: Left Arm)   Pulse 96   Temp 98.2 F (36.8 C) (Oral)   Resp 16   Ht 5' 6.5" (1.689 m)   Wt 288 lb (130.6 kg)   LMP 12/20/2016   SpO2 100%   BMI 45.79 kg/m  CONSTITUTIONAL: Alert and oriented and responds appropriately to questions. Well-appearing; well-nourished, well-hydrated HEAD: Normocephalic EYES: Conjunctivae clear, PERRL, EOMI ENT: normal nose; no rhinorrhea; moist mucous membranes NECK: Supple, no meningismus, no nuchal rigidity, no LAD  CARD: RRR; S1 and S2 appreciated; no murmurs, no clicks, no rubs, no gallops RESP: Normal chest excursion without splinting or tachypnea; breath sounds clear and equal bilaterally; no wheezes, no rhonchi, no rales, no hypoxia or respiratory distress, speaking full sentences ABD/GI: Normal bowel sounds; non-distended; soft, mildly tender throughout her abdomen, no rebound, no guarding, no peritoneal signs, no hepatosplenomegaly BACK:  The back appears normal and is non-tender to palpation, there is no CVA tenderness EXT: Normal ROM in all joints; non-tender to palpation; no edema; normal capillary refill; no cyanosis, no calf tenderness or swelling  SKIN: Normal color for age and race; warm; no rash NEURO: Moves all extremities equally, sensation to light touch intact diffusely, cranial nerves II through XII intact, normal speech PSYCH: The patient's mood and manner are appropriate. Grooming and personal hygiene are appropriate.  MEDICAL DECISION MAKING: Patient here with low-grade temperature, vomiting and diarrhea that started at 11 AM yesterday.  She does work in Teacher, music. Suspect viral gastroenteritis as her abdominal exam is nonfocal and benign. We'll obtain labs, urine. Will treat symptomatically with IV fluids, Zofran, Bentyl, Imodium. I do not feel she needs emergent imaging at this time.  ED PROGRESS: Patient's labs are unremarkable. Abdominal exam is still benign. Urine pending. She reports feeling somewhat better but still having some pain and nausea. We'll give Toradol, Zofran. She has developed fever here of 101.2. She has been given Tylenol.  Urine shows leukocytes but also many bacteria and many squamous cells. Suspect dirty catch. She is not having urinary symptoms. Pregnancy test is negative.   3:25 AM  Pt's repeat abdominal exam is completely benign. She is not tender at McBurney's point and has a negative Murphy sign. Again I suspect that this is viral gastroenteritis and have recommended bland diet for the next several days, Imodium as needed for diarrhea, alternating Tylenol and ibuprofen as needed for fever. We'll discharge with prescription of Zofran for nausea and vomiting. Recommended if pain worsens, vomiting stops as uncontrolled, blood in the stool, she begins to have localized right upper right lower quadrant pain that she return to the emergency department or her primary care office. She is comfortable with this plan. Will provide with work note.   At this time, I do not feel there is any life-threatening condition present. I have reviewed and discussed all results (EKG, imaging, lab, urine as appropriate) and exam findings with patient/family. I have reviewed nursing notes and appropriate previous records.  I feel the patient is safe to be discharged home without further emergent workup and can continue workup as an outpatient as needed. Discussed usual and customary return precautions. Patient/family verbalize understanding and are comfortable with this plan.  Outpatient follow-up has been provided. All questions have  been answered.      Layla Maw Lester Crickenberger, DO 01/09/17 [phone]

## 2017-01-09 NOTE — Discharge Instructions (Addendum)
You may alternate between Tylenol 1000 mg every 6 hours as needed for fever and pain and ibuprofen 800 mg every 8 hours as needed for fever and pain. You may use Imodium over-the-counter as needed for diarrhea. Please eat a bland diet for the next several days and increase your water intake. If you begin having worsening pain especially because localized to the right upper abdomen, right lower abdomen, vomiting that will not stop, Blood in Your Stool, Please Return to the Hospital.   To find a primary care or specialty doctor please call [phone] or [phone] to access " Find a Doctor Service."  You may also go on the [facility] website at [email or web address]  There are also multiple [facility], [facility] practices throughout the [place] that are frequently accepting new patients. You may find a clinic that is close to your home and contact them.  [facility] -  [address] [place] [phone]   [facility] -  [address] [place] [id number] [phone]   [facility] [phone]  [place] [phone]

## 2017-04-21 ENCOUNTER — Emergency Department (HOSPITAL_BASED_OUTPATIENT_CLINIC_OR_DEPARTMENT_OTHER)
Admission: EM | Admit: 2017-04-21 | Discharge: 2017-04-21 | Disposition: A | Discharge status: Home / Self Care | Payer: PRIVATE HEALTH INSURANCE | Source: Home / Self Care | Attending: Emergency Medicine | Admitting: Emergency Medicine

## 2017-04-21 ENCOUNTER — Encounter (HOSPITAL_BASED_OUTPATIENT_CLINIC_OR_DEPARTMENT_OTHER): Payer: Self-pay | Admitting: *Deleted

## 2017-04-21 ENCOUNTER — Encounter (HOSPITAL_BASED_OUTPATIENT_CLINIC_OR_DEPARTMENT_OTHER): Payer: Self-pay

## 2017-04-21 ENCOUNTER — Emergency Department (HOSPITAL_BASED_OUTPATIENT_CLINIC_OR_DEPARTMENT_OTHER)
Admission: EM | Admit: 2017-04-21 | Discharge: 2017-04-21 | Disposition: A | Discharge status: Home / Self Care | Payer: PRIVATE HEALTH INSURANCE | Attending: Emergency Medicine | Admitting: Emergency Medicine

## 2017-04-21 DIAGNOSIS — M5442 Lumbago with sciatica, left side: Secondary | ICD-10-CM

## 2017-04-21 DIAGNOSIS — Z793 Long term (current) use of hormonal contraceptives: Secondary | ICD-10-CM | POA: Insufficient documentation

## 2017-04-21 DIAGNOSIS — M545 Low back pain: Secondary | ICD-10-CM | POA: Diagnosis present

## 2017-04-21 MED ORDER — KETOROLAC TROMETHAMINE 30 MG/ML IJ SOLN
30.0000 mg | Freq: Once | INTRAMUSCULAR | Status: AC
  Administered 2017-04-21: 30 mg via INTRAMUSCULAR
  Filled 2017-04-21: qty 1

## 2017-04-21 MED ORDER — METHOCARBAMOL 500 MG PO TABS: 500.0000 mg | ORAL_TABLET | Freq: Two times a day (BID) | ORAL | 0 refills | Status: DC

## 2017-04-21 MED ORDER — IBUPROFEN 600 MG PO TABS: 600.0000 mg | ORAL_TABLET | Freq: Four times a day (QID) | ORAL | 0 refills | Status: DC | PRN

## 2017-04-21 MED ORDER — LIDOCAINE 5 % EX PTCH: 1.0000 | MEDICATED_PATCH | CUTANEOUS | 0 refills | Status: DC

## 2017-04-21 MED ORDER — HYDROCODONE-ACETAMINOPHEN 5-325 MG PO TABS: 1.0000 | ORAL_TABLET | Freq: Four times a day (QID) | ORAL | 0 refills | Status: DC | PRN

## 2017-04-21 NOTE — Discharge Instructions (Signed)
Take it easy, but do not lay around too much as this may make any stiffness worse.  Antiinflammatory medications: Take 600 mg of ibuprofen every 6 hours or 440 mg (over the counter dose) to 500 mg (prescription dose) of naproxen every 12 hours or for the next 3 days. After this time, these medications may be used as needed for pain. Take these medications with food to avoid upset stomach. Choose only one of these medications, do not take them together.   Muscle relaxer: Robaxin is a muscle relaxer and may help loosen stiff muscles. Do not take the Robaxin while driving or performing other dangerous activities.   Lidocaine patches: These are available via either prescription or over-the-counter. The over-the-counter option may be more economical one and are likely just as effective. There are multiple over-the-counter brands, such as Salonpas.  Exercises: Be sure to perform the attached exercises starting with three times a week and working up to performing them daily. This is an essential part of preventing long term problems.   Follow up with a primary care provider for any future management of these complaints.

## 2017-04-21 NOTE — ED Notes (Signed)
Pt was seen and treated by ED provider prior to RN seeing.

## 2017-04-21 NOTE — ED Triage Notes (Signed)
Reports tried getting rx from [facility] but told her she couldn't get it until tomorrow.  Reports having pain in back still and wants a steroid shot.

## 2017-04-21 NOTE — ED Triage Notes (Addendum)
Pt c/o lower back pain radiates down left leg increased pain with movt x 5 days

## 2017-04-21 NOTE — Discharge Instructions (Signed)
Take medications as previously prescribed. Follow-up with PCP for further evaluation. Return to ED for worsening pain, numbness, weakness, trouble walking, loss of bowel or bladder function, injury.

## 2017-04-21 NOTE — ED Provider Notes (Signed)
[facility] Provider Note   CSN: [id number] Arrival date & time: 04/21/17  1228     History   Chief Complaint Chief Complaint  Patient presents with  . Back Pain    HPI Chelsea Harper is a 36 y.o. female.  HPI   Chelsea Harper is a 36 y.o. female, with a history of previous back pain, presenting to the ED with left lower back pain beginning 5 days ago. Pain is described as a tightness and intermittent spasms, rates it 10/10, worse with prolonged sitting or movement, pain radiating down the left leg. Increased stiffness with prolonged sitting or lying down. 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 States she has had previous pain in her lower back, but it has been "awhile." LMP June 8.  Denies fever/chills, N/V/C/D, urinary complaints, abdominal pain, numbness, weakness, or any other complaints.    Past Medical History:  Diagnosis Date  . Gestational diabetes   . History of gestational diabetes 10/05/2014    [facility] Prenatal Labs  Dating LMP Blood type: O/POS/-- (12/01 1436)  Genetic Screen  Quad:           Normal 10/14/2014 Antibody:NEG (12/01 1436)  Anatomic [place]  1/8 nml with limited views of heart => [ ]  f/u Rubella: 0.91 (12/01 1436)  GTT Early:153.  3hr: 88, 149, 157, 117    Third trimester: 106, 180, 178, 161 RPR: NON REAC (02/04 1718)   TDaP vaccine April 2016 HBsAg: NEGATIVE (12/01 143  . History of gestational hypertension 02/28/2015    Patient Active Problem List   Diagnosis Date Noted  . History of gestational hypertension 02/28/2015  . AS (sickle cell trait) (HCC) 11/11/2014  . History of gestational diabetes 10/05/2014  . Depression, recurrent (HCC) 08/06/2014    Past Surgical History:  Procedure Laterality Date  . NO PAST SURGERIES      OB History    Gravida Para Term Preterm AB Living   1 1 1     1    SAB TAB Ectopic Multiple Live Births         0 1       Home Medications    Prior to Admission medications     Medication Sig Start Date End Date Taking? Authorizing Provider  dicyclomine (BENTYL) 20 MG tablet Take 1 tablet (20 mg total) by mouth every 8 (eight) hours as needed for spasms. 01/09/17   Ward, Layla Maw, DO  HYDROcodone-acetaminophen (NORCO/VICODIN) 5-325 MG tablet Take 1-2 tablets by mouth every 6 (six) hours as needed for severe pain. 04/21/17   Joy, Shawn C, PA-C  ibuprofen (ADVIL,MOTRIN) 600 MG tablet Take 1 tablet (600 mg total) by mouth every 6 (six) hours as needed. 04/21/17   Joy, Shawn C, PA-C  lidocaine (LIDODERM) 5 % Place 1 patch onto the skin daily. Remove & Discard patch within 12 hours or as directed by MD 04/21/17   Joy, Shawn C, PA-C  methocarbamol (ROBAXIN) 500 MG tablet Take 1 tablet (500 mg total) by mouth 2 (two) times daily. 04/21/17   Joy, Shawn C, PA-C  naproxen (NAPROSYN) 500 MG tablet Take 1 tablet (500 mg total) by mouth 2 (two) times daily as needed (for chest wall pain). 06/22/16   Molpus, John, MD  norgestimate-ethinyl estradiol (ORTHO-CYCLEN,SPRINTEC,PREVIFEM) 0.25-35 MG-MCG tablet Take 1 tablet by mouth daily. 05/16/16   Aviva Signs, CNM  ondansetron (ZOFRAN ODT) 4 MG disintegrating tablet Take 1-2 tablets (4-8 mg total) by mouth every 8 (eight)  hours as needed for nausea or vomiting. 01/09/17   Ward, Layla MawKristen N, DO    Family History Family History  Problem Relation Age of Onset  . Diabetes Mother   . Heart disease Mother   . Cancer Mother        breast  . Hypertension Brother     Social History Social History  Substance Use Topics  . Smoking status: Never Smoker  . Smokeless tobacco: Never Used  . Alcohol use No     Allergies   Patient has no known allergies.   Review of Systems Review of Systems  Constitutional: Negative for chills and fever.  Gastrointestinal: Negative for abdominal pain, constipation, diarrhea, nausea and vomiting.  Musculoskeletal: Positive for back pain. Negative for neck pain.  Neurological: Negative for weakness and  numbness.  All other systems reviewed and are negative.    Physical Exam Updated Vital Signs BP (!) 154/101   Pulse 87   Temp 98.6 F (37 C) (Oral)   Resp 16   Ht 5\' 6"  (1.676 m)   Wt (!) 174.6 kg (385 lb)   LMP 04/07/2017   SpO2 100%   BMI 62.14 kg/m   Physical Exam  Constitutional: She appears well-developed and well-nourished. No distress.  HENT:  Head: Normocephalic and atraumatic.  Eyes: Conjunctivae are normal.  Neck: Neck supple.  Cardiovascular: Normal rate, regular rhythm, normal heart sounds and intact distal pulses.   Pulmonary/Chest: Effort normal and breath sounds normal. No respiratory distress.  Abdominal: Soft. There is no tenderness. There is no guarding.  Musculoskeletal: She exhibits tenderness. She exhibits no edema.  Tenderness to left lumbar musculature into left buttock. Normal motor function intact in both lower extremities and spine. No midline spinal tenderness.   Lymphadenopathy:    She has no cervical adenopathy.  Neurological: She is alert.  No sensory deficits. Strength in the bilateral lower extremities 5/5. Antalgic gait.   Skin: Skin is warm and dry. She is not diaphoretic.  Psychiatric: She has a normal mood and affect. Her behavior is normal.  Nursing note and vitals reviewed.    ED Treatments / Results  Labs (all labs ordered are listed, but only abnormal results are displayed) Labs Reviewed - No data to display  EKG  EKG Interpretation None       Radiology No results found.  Procedures Procedures (including critical care time)  Medications Ordered in ED Medications - No data to display   Initial Impression / Assessment and Plan / ED Course  I have reviewed the triage vital signs and the nursing notes.  Pertinent labs & imaging results that were available during my care of the patient were reviewed by me and considered in my medical decision making (see chart for details).      Patient presents with lower left  back pain with symptoms of sciatica. No neuro or functional deficits. No red flag symptoms. Patient has an appointment with her PCP on Tuesday. The patient was given instructions for home care as well as return precautions. Patient voices understanding of these instructions, accepts the plan, and is comfortable with discharge.    Final Clinical Impressions(s) / ED Diagnoses   Final diagnoses:  Acute left-sided low back pain with left-sided sciatica    New Prescriptions New Prescriptions   HYDROCODONE-ACETAMINOPHEN (NORCO/VICODIN) 5-325 MG TABLET    Take 1-2 tablets by mouth every 6 (six) hours as needed for severe pain.   IBUPROFEN (ADVIL,MOTRIN) 600 MG TABLET    Take 1  tablet (600 mg total) by mouth every 6 (six) hours as needed.   LIDOCAINE (LIDODERM) 5 %    Place 1 patch onto the skin daily. Remove & Discard patch within 12 hours or as directed by MD   METHOCARBAMOL (ROBAXIN) 500 MG TABLET    Take 1 tablet (500 mg total) by mouth 2 (two) times daily.     Anselm Pancoast, PA-C 04/21/17 1556    Tilden Fossa, MD 04/22/17 [phone]

## 2017-04-21 NOTE — ED Notes (Signed)
Pt given rx x 4 for robaxin, hydrocodone, ibuprofen, and lidoderm patches. Note for work given

## 2017-04-21 NOTE — ED Provider Notes (Signed)
[facility]   CSN: [id number] Arrival date & time: 04/21/17  2058  By signing my name below, I, Doreatha Martin, attest that this documentation has been prepared under the direction and in the presence of Jaysten Essner, PA-C. Electronically Signed: Doreatha Martin, ED Scribe. 04/21/17. 10:45 PM.   History   Chief Complaint Chief Complaint  Patient presents with  . Back Pain    HPI Chelsea Harper is a 36 y.o. female who presents to the Emergency Department complaining of moderate left lower back pain with shooting radiation to the left leg for 5 days. Pt reports pain is worsened in certain positions and with movement. No recent falls, injury or trauma. No h/o CA, IVDU. Pt was seen in the ED earlier today for the same complaints, was dx with sciatica and was sent home with rx for Vicodin, Advil, Lidoderm and Robaxin. She states she was able to pick up all meds aside from Vicodin, as [facility] said it was not available until tomorrow, and tried them with inadequate relief. She returns to the ED for additional pain control until she can pick up the Vicodin at [facility] tomorrow. She is able to walk without difficulty. She reports h/o similar pain 12 years ago. She denies numbness, weakness, bowel or bladder incontinence, dysuria, CP, SOB, hematuria, frequency, urgency. No changes from pain after being seen earlier today.  The history is provided by the patient. No language interpreter was used.    Past Medical History:  Diagnosis Date  . Gestational diabetes   . History of gestational diabetes 10/05/2014    [facility] Prenatal Labs  Dating LMP Blood type: O/POS/-- (12/01 1436)  Genetic Screen  Quad:           Normal 10/14/2014 Antibody:NEG (12/01 1436)  Anatomic [place]  1/8 nml with limited views of heart => [ ]  f/u Rubella: 0.91 (12/01 1436)  GTT Early:153.  3hr: 88, 149, 157, 117    Third trimester: 106, 180, 178, 161 RPR: NON REAC (02/04 1718)   TDaP vaccine April 2016 HBsAg: NEGATIVE  (12/01 143  . History of gestational hypertension 02/28/2015    Patient Active Problem List   Diagnosis Date Noted  . History of gestational hypertension 02/28/2015  . AS (sickle cell trait) (HCC) 11/11/2014  . History of gestational diabetes 10/05/2014  . Depression, recurrent (HCC) 08/06/2014    Past Surgical History:  Procedure Laterality Date  . NO PAST SURGERIES      OB History    Gravida Para Term Preterm AB Living   1 1 1     1    SAB TAB Ectopic Multiple Live Births         0 1       Home Medications    Prior to Admission medications   Medication Sig Start Date End Date Taking? Authorizing Provider  dicyclomine (BENTYL) 20 MG tablet Take 1 tablet (20 mg total) by mouth every 8 (eight) hours as needed for spasms. 01/09/17   Ward, Layla Maw, DO  HYDROcodone-acetaminophen (NORCO/VICODIN) 5-325 MG tablet Take 1-2 tablets by mouth every 6 (six) hours as needed for severe pain. 04/21/17   Joy, Shawn C, PA-C  ibuprofen (ADVIL,MOTRIN) 600 MG tablet Take 1 tablet (600 mg total) by mouth every 6 (six) hours as needed. 04/21/17   Joy, Shawn C, PA-C  lidocaine (LIDODERM) 5 % Place 1 patch onto the skin daily. Remove & Discard patch within 12 hours or as directed by MD 04/21/17  Joy, Shawn C, PA-C  methocarbamol (ROBAXIN) 500 MG tablet Take 1 tablet (500 mg total) by mouth 2 (two) times daily. 04/21/17   Joy, Shawn C, PA-C  naproxen (NAPROSYN) 500 MG tablet Take 1 tablet (500 mg total) by mouth 2 (two) times daily as needed (for chest wall pain). 06/22/16   Molpus, John, MD  norgestimate-ethinyl estradiol (ORTHO-CYCLEN,SPRINTEC,PREVIFEM) 0.25-35 MG-MCG tablet Take 1 tablet by mouth daily. 05/16/16   Aviva Signs, CNM  ondansetron (ZOFRAN ODT) 4 MG disintegrating tablet Take 1-2 tablets (4-8 mg total) by mouth every 8 (eight) hours as needed for nausea or vomiting. 01/09/17   Ward, Layla Maw, DO    Family History Family History  Problem Relation Age of Onset  . Diabetes Mother   .  Heart disease Mother   . Cancer Mother        breast  . Hypertension Brother     Social History Social History  Substance Use Topics  . Smoking status: Never Smoker  . Smokeless tobacco: Never Used  . Alcohol use No     Allergies   Patient has no known allergies.   Review of Systems Review of Systems  Respiratory: Negative for shortness of breath.   Cardiovascular: Negative for chest pain.  Genitourinary: Negative for difficulty urinating, dysuria, frequency, hematuria and urgency.       No bowel or bladder incontinence.   Musculoskeletal: Positive for back pain.  Neurological: Negative for weakness and numbness.  All other systems reviewed and are negative.    Physical Exam Updated Vital Signs BP (!) 145/99 (BP Location: Left Arm)   Pulse 74   Temp 98.1 F (36.7 C) (Oral)   Resp 20   Ht 5\' 6"  (1.676 m)   Wt (!) 174.6 kg (385 lb)   LMP 04/07/2017   SpO2 99%   BMI 62.14 kg/m   Physical Exam  Constitutional: She appears well-developed and well-nourished. No distress.  HENT:  Head: Normocephalic and atraumatic.  Eyes: Conjunctivae and EOM are normal. No scleral icterus.  Neck: Normal range of motion.  Pulmonary/Chest: Effort normal. No respiratory distress.  Musculoskeletal: She exhibits tenderness. She exhibits no deformity.  Tenderness to the left lumbar paraspinal musculature. No midline spinal tenderness to the C, T or L spine. No step-offs, crepitus or deformity.   Neurological: She is alert. No sensory deficit. She exhibits normal muscle tone.  Ambulatory with normal gait. Normal motor and sensation to the extremities. No objective signs of numbness. Strength 5/5 in BLE.  Skin: No rash noted. She is not diaphoretic.  Psychiatric: She has a normal mood and affect.  Nursing note and vitals reviewed.    ED Treatments / Results   DIAGNOSTIC STUDIES: Oxygen Saturation is 100% on RA, normal by my interpretation.    COORDINATION OF CARE: 10:39 PM  Discussed treatment plan with pt at bedside which includes IM Toradol and pt agreed to plan.   Procedures Procedures (including critical care time)  Medications Ordered in ED Medications  ketorolac (TORADOL) 30 MG/ML injection 30 mg (30 mg Intramuscular Given 04/21/17 2256)     Initial Impression / Assessment and Plan / ED Course  I have reviewed the triage vital signs and the nursing notes.      Patient with back pain seen earlier today. States could not get narcotic pain medication until tomorrow so would like additional pain control here today in ED.  No neurological deficits and normal neuro exam.  Patient is ambulatory.  No loss of  bowel or bladder control.  No concern for cauda equina. No red flags. No fever, night sweats, weight loss, h/o cancer, IVDA, no recent procedure to back. No urinary symptoms suggestive of UTI. No reported changes in pain since previous visit earlier today. IM Toradol given in the ED for further pain control.  Supportive care and return precaution discussed. Advised her to continue other medications given until her rx could be filled. Appears safe for discharge at this time. Follow up as indicated in discharge paperwork. Strict return precautions given.  Final Clinical Impressions(s) / ED Diagnoses   Final diagnoses:  Left-sided low back pain with left-sided sciatica, unspecified chronicity    New Prescriptions Discharge Medication List as of 04/21/2017 10:49 PM      I personally performed the services described in this documentation, which was scribed in my presence. The recorded information has been reviewed and is accurate.    Dietrich PatesKhatri, Miyuki Rzasa, PA-C 04/22/17 1305    Nira Connardama, Pedro Eduardo, MD 04/24/17 [phone]

## 2017-04-21 NOTE — ED Notes (Signed)
Pt given d/c instructions as per chart. Verbalizes understanding. No questions. 

## 2017-04-26 ENCOUNTER — Emergency Department (HOSPITAL_BASED_OUTPATIENT_CLINIC_OR_DEPARTMENT_OTHER)
Admission: EM | Admit: 2017-04-26 | Discharge: 2017-04-26 | Disposition: A | Discharge status: Home / Self Care | Payer: PRIVATE HEALTH INSURANCE | Attending: Physician Assistant | Admitting: Physician Assistant

## 2017-04-26 ENCOUNTER — Encounter (HOSPITAL_BASED_OUTPATIENT_CLINIC_OR_DEPARTMENT_OTHER): Payer: Self-pay

## 2017-04-26 DIAGNOSIS — M545 Low back pain, unspecified: Secondary | ICD-10-CM

## 2017-04-26 DIAGNOSIS — M549 Dorsalgia, unspecified: Secondary | ICD-10-CM | POA: Diagnosis present

## 2017-04-26 DIAGNOSIS — Z79899 Other long term (current) drug therapy: Secondary | ICD-10-CM | POA: Diagnosis not present

## 2017-04-26 NOTE — Discharge Instructions (Signed)
Followed primary care physician. Return with any concerns.

## 2017-04-26 NOTE — ED Triage Notes (Signed)
C/o lower back pain x 1+ week-was seen by PCP yesterday for same-NAD-steady gait

## 2017-04-26 NOTE — ED Provider Notes (Signed)
[facility]   CSN: [id number] Arrival date & time: 04/26/17  1552     History   Chief Complaint Chief Complaint  Patient presents with  . Back Pain    HPI Chelsea Harper is a 36 y.o. female.  HPI   Patient is a 36 year old female pursuing with back pain. Patient had a back pain for last 10 days. Patient had multiple ED visits for it. She also follow up with her primary care physician. She reports that they wrote her fro FMLA.. She is here today because she would like a work note for the next two days. Patient already has multiple prescription's at home to help with her pain. She does not require any additional prescriptions at this time.  Past Medical History:  Diagnosis Date  . History of gestational diabetes 10/05/2014    [facility] Prenatal Labs  Dating LMP Blood type: O/POS/-- (12/01 1436)  Genetic Screen  Quad:           Normal 10/14/2014 Antibody:NEG (12/01 1436)  Anatomic [place]  1/8 nml with limited views of heart => [ ]  f/u Rubella: 0.91 (12/01 1436)  GTT Early:153.  3hr: 88, 149, 157, 117    Third trimester: 106, 180, 178, 161 RPR: NON REAC (02/04 1718)   TDaP vaccine April 2016 HBsAg: NEGATIVE (12/01 143  . History of gestational hypertension 02/28/2015    Patient Active Problem List   Diagnosis Date Noted  . History of gestational hypertension 02/28/2015  . AS (sickle cell trait) (HCC) 11/11/2014  . History of gestational diabetes 10/05/2014  . Depression, recurrent (HCC) 08/06/2014    Past Surgical History:  Procedure Laterality Date  . NO PAST SURGERIES      OB History    Gravida Para Term Preterm AB Living   1 1 1     1    SAB TAB Ectopic Multiple Live Births         0 1       Home Medications    Prior to Admission medications   Medication Sig Start Date End Date Taking? Authorizing Provider  dicyclomine (BENTYL) 20 MG tablet Take 1 tablet (20 mg total) by mouth every 8 (eight) hours as needed for spasms. 01/09/17   Ward,  Layla Maw, DO  HYDROcodone-acetaminophen (NORCO/VICODIN) 5-325 MG tablet Take 1-2 tablets by mouth every 6 (six) hours as needed for severe pain. 04/21/17   Joy, Shawn C, PA-C  ibuprofen (ADVIL,MOTRIN) 600 MG tablet Take 1 tablet (600 mg total) by mouth every 6 (six) hours as needed. 04/21/17   Joy, Shawn C, PA-C  lidocaine (LIDODERM) 5 % Place 1 patch onto the skin daily. Remove & Discard patch within 12 hours or as directed by MD 04/21/17   Joy, Shawn C, PA-C  methocarbamol (ROBAXIN) 500 MG tablet Take 1 tablet (500 mg total) by mouth 2 (two) times daily. 04/21/17   Joy, Shawn C, PA-C  naproxen (NAPROSYN) 500 MG tablet Take 1 tablet (500 mg total) by mouth 2 (two) times daily as needed (for chest wall pain). 06/22/16   Molpus, John, MD  norgestimate-ethinyl estradiol (ORTHO-CYCLEN,SPRINTEC,PREVIFEM) 0.25-35 MG-MCG tablet Take 1 tablet by mouth daily. 05/16/16   Aviva Signs, CNM  ondansetron (ZOFRAN ODT) 4 MG disintegrating tablet Take 1-2 tablets (4-8 mg total) by mouth every 8 (eight) hours as needed for nausea or vomiting. 01/09/17   Ward, Layla Maw, DO    Family History Family History  Problem Relation Age of Onset  .  Diabetes Mother   . Heart disease Mother   . Cancer Mother        breast  . Hypertension Brother     Social History Social History  Substance Use Topics  . Smoking status: Never Smoker  . Smokeless tobacco: Never Used  . Alcohol use Yes     Comment: occ     Allergies   Patient has no known allergies.   Review of Systems Review of Systems  Constitutional: Negative for activity change, fatigue and fever.  HENT: Negative for congestion.   Eyes: Negative for discharge.  Cardiovascular: Negative for chest pain.  Gastrointestinal: Negative for abdominal distention.  Genitourinary: Negative for difficulty urinating.  Musculoskeletal: Positive for back pain.  Skin: Negative for rash.  Neurological: Negative for weakness and numbness.  All other systems reviewed  and are negative.    Physical Exam Updated Vital Signs BP (!) 136/96 (BP Location: Right Arm)   Pulse 84   Temp 98.6 F (37 C) (Oral)   Resp 20   Ht 5\' 6"  (1.676 m)   Wt (!) 139.7 kg (308 lb)   LMP 04/12/2017   SpO2 98%   BMI 49.71 kg/m   Physical Exam  Constitutional: She is oriented to person, place, and time. She appears well-developed and well-nourished.  HENT:  Head: Normocephalic and atraumatic.  Eyes: Right eye exhibits no discharge. Left eye exhibits no discharge.  Cardiovascular: Normal rate and regular rhythm.   Pulmonary/Chest: Effort normal and breath sounds normal.  Neurological: She is oriented to person, place, and time.  Ambulatory.  Skin: Skin is warm and dry. She is not diaphoretic.  Psychiatric: She has a normal mood and affect.  Nursing note and vitals reviewed.    ED Treatments / Results  Labs (all labs ordered are listed, but only abnormal results are displayed) Labs Reviewed - No data to display  EKG  EKG Interpretation None       Radiology No results found.  Procedures Procedures (including critical care time)  Medications Ordered in ED Medications - No data to display   Initial Impression / Assessment and Plan / ED Course  I have reviewed the triage vital signs and the nursing notes.  Pertinent labs & imaging results that were available during my care of the patient were reviewed by me and considered in my medical decision making (see chart for details).    Patient is 36 year old well-appearing female. Patient has obesity and history of back pain. Patient's been seen multiple times for this. She has multiple prescriptions at home for this. Patient is just here for work note. Patient is ambulatory, no red flags. Do not think imaging is needed at this time. We'll write her for work note and follow-up with her primary care physician.   Final Clinical Impressions(s) / ED Diagnoses   Final diagnoses:  None    New  Prescriptions New Prescriptions   No medications on file     Abelino DerrickMackuen, Cache Decoursey Lyn, MD 04/26/17 1657

## 2017-06-22 ENCOUNTER — Encounter (HOSPITAL_BASED_OUTPATIENT_CLINIC_OR_DEPARTMENT_OTHER): Payer: Self-pay | Admitting: Adult Health

## 2017-06-22 ENCOUNTER — Emergency Department (HOSPITAL_BASED_OUTPATIENT_CLINIC_OR_DEPARTMENT_OTHER)
Admission: EM | Admit: 2017-06-22 | Discharge: 2017-06-23 | Disposition: A | Discharge status: Home / Self Care | Payer: PRIVATE HEALTH INSURANCE | Attending: Emergency Medicine | Admitting: Emergency Medicine

## 2017-06-22 DIAGNOSIS — A599 Trichomoniasis, unspecified: Secondary | ICD-10-CM

## 2017-06-22 DIAGNOSIS — Z3A01 Less than 8 weeks gestation of pregnancy: Secondary | ICD-10-CM | POA: Insufficient documentation

## 2017-06-22 DIAGNOSIS — N939 Abnormal uterine and vaginal bleeding, unspecified: Secondary | ICD-10-CM

## 2017-06-22 DIAGNOSIS — O209 Hemorrhage in early pregnancy, unspecified: Secondary | ICD-10-CM | POA: Insufficient documentation

## 2017-06-22 DIAGNOSIS — O98311 Other infections with a predominantly sexual mode of transmission complicating pregnancy, first trimester: Secondary | ICD-10-CM | POA: Insufficient documentation

## 2017-06-22 DIAGNOSIS — Z79899 Other long term (current) drug therapy: Secondary | ICD-10-CM | POA: Insufficient documentation

## 2017-06-22 NOTE — ED Triage Notes (Addendum)
Presents with vaginal bleeding that began yesterdayand is worsening throughout the day associated with back pain and cramping. Endorses finding out she is pregnant one week ago with a home pregnancy test. usiting pads at this time. HAs used on pad today.  Gravida 2 para 1.

## 2017-06-23 ENCOUNTER — Encounter (HOSPITAL_BASED_OUTPATIENT_CLINIC_OR_DEPARTMENT_OTHER): Payer: Self-pay | Admitting: Emergency Medicine

## 2017-06-23 LAB — CBC WITH DIFFERENTIAL/PLATELET
BASOS ABS: 0 10*3/uL (ref 0.0–0.1)
Basophils Relative: 0 %
EOS ABS: 0.3 10*3/uL (ref 0.0–0.7)
EOS PCT: 2 %
HCT: 33.7 % — ABNORMAL LOW (ref 36.0–46.0)
Hemoglobin: 11.2 g/dL — ABNORMAL LOW (ref 12.0–15.0)
LYMPHS PCT: 38 %
Lymphs Abs: 4.5 10*3/uL — ABNORMAL HIGH (ref 0.7–4.0)
MCH: 26.9 pg (ref 26.0–34.0)
MCHC: 33.2 g/dL (ref 30.0–36.0)
MCV: 81 fL (ref 78.0–100.0)
MONO ABS: 0.6 10*3/uL (ref 0.1–1.0)
Monocytes Relative: 5 %
Neutro Abs: 6.4 10*3/uL (ref 1.7–7.7)
Neutrophils Relative %: 55 %
PLATELETS: 332 10*3/uL (ref 150–400)
RBC: 4.16 MIL/uL (ref 3.87–5.11)
RDW: 14.7 % (ref 11.5–15.5)
WBC: 11.7 10*3/uL — AB (ref 4.0–10.5)

## 2017-06-23 LAB — URINALYSIS, ROUTINE W REFLEX MICROSCOPIC
BILIRUBIN URINE: NEGATIVE
Glucose, UA: NEGATIVE mg/dL
Ketones, ur: NEGATIVE mg/dL
NITRITE: NEGATIVE
PH: 6 (ref 5.0–8.0)
Protein, ur: NEGATIVE mg/dL
Specific Gravity, Urine: 1.012 (ref 1.005–1.030)

## 2017-06-23 LAB — BASIC METABOLIC PANEL
ANION GAP: 9 (ref 5–15)
BUN: 12 mg/dL (ref 6–20)
CALCIUM: 8.7 mg/dL — AB (ref 8.9–10.3)
CO2: 25 mmol/L (ref 22–32)
Chloride: 105 mmol/L (ref 101–111)
Creatinine, Ser: 1.15 mg/dL — ABNORMAL HIGH (ref 0.44–1.00)
GLUCOSE: 96 mg/dL (ref 65–99)
POTASSIUM: 3.5 mmol/L (ref 3.5–5.1)
SODIUM: 139 mmol/L (ref 135–145)

## 2017-06-23 LAB — WET PREP, GENITAL
SPERM: NONE SEEN
YEAST WET PREP: NONE SEEN

## 2017-06-23 LAB — URINALYSIS, MICROSCOPIC (REFLEX)

## 2017-06-23 LAB — HCG, QUANTITATIVE, PREGNANCY

## 2017-06-23 MED ORDER — AZITHROMYCIN 1 G PO PACK
1.0000 g | PACK | Freq: Once | ORAL | Status: AC
  Administered 2017-06-23: 1 g via ORAL
  Filled 2017-06-23: qty 1

## 2017-06-23 MED ORDER — CEFTRIAXONE SODIUM 250 MG IJ SOLR
250.0000 mg | Freq: Once | INTRAMUSCULAR | Status: AC
Start: 2017-06-23 — End: 2017-06-23
  Administered 2017-06-23: 250 mg via INTRAMUSCULAR
  Filled 2017-06-23: qty 250

## 2017-06-23 MED ORDER — METRONIDAZOLE 500 MG PO TABS
2000.0000 mg | ORAL_TABLET | Freq: Once | ORAL | Status: AC
Start: 2017-06-23 — End: 2017-06-23
  Administered 2017-06-23: 2000 mg via ORAL
  Filled 2017-06-23: qty 4

## 2017-06-23 NOTE — ED Provider Notes (Signed)
[facility] Provider Note   CSN: [id number] Arrival date & time: 06/22/17  2346     History   Chief Complaint Chief Complaint  Patient presents with  . Vaginal Bleeding    HPI Chelsea Harper is a 36 y.o. female.  The history is provided by the patient.  Vaginal Bleeding  Primary symptoms include vaginal bleeding.  Primary symptoms include no dyspareunia, no genital lesions. There has been no fever. This is a new problem. The current episode started 2 days ago. The problem occurs constantly. The problem has not changed since onset.The symptoms occur spontaneously. She is pregnant. She has missed her period. LMP: 6/30. The patient's menstrual history has been regular. She uses nothing for contraception. Associated medical issues do not include STD.  G2 P1 at 7 weeks by LMP with bleeding and cramping starting Friday.  Is O+.  States home pregnancy test was positive and is now having midline cramping  Past Medical History:  Diagnosis Date  . History of gestational diabetes 10/05/2014    [facility] Prenatal Labs  Dating LMP Blood type: O/POS/-- (12/01 1436)  Genetic Screen  Quad:           Normal 10/14/2014 Antibody:NEG (12/01 1436)  Anatomic [place]  1/8 nml with limited views of heart => [ ]  f/u Rubella: 0.91 (12/01 1436)  GTT Early:153.  3hr: 88, 149, 157, 117    Third trimester: 106, 180, 178, 161 RPR: NON REAC (02/04 1718)   TDaP vaccine Jaecion Dempster 2016 HBsAg: NEGATIVE (12/01 143  . History of gestational hypertension 02/28/2015    Patient Active Problem List   Diagnosis Date Noted  . History of gestational hypertension 02/28/2015  . AS (sickle cell trait) (HCC) 11/11/2014  . History of gestational diabetes 10/05/2014  . Depression, recurrent (HCC) 08/06/2014    Past Surgical History:  Procedure Laterality Date  . NO PAST SURGERIES      OB History    Gravida Para Term Preterm AB Living   2 1 1     1    SAB TAB Ectopic Multiple Live Births         0 1        Home Medications    Prior to Admission medications   Medication Sig Start Date End Date Taking? Authorizing Provider  dicyclomine (BENTYL) 20 MG tablet Take 1 tablet (20 mg total) by mouth every 8 (eight) hours as needed for spasms. 01/09/17   Ward, Layla Maw, DO  HYDROcodone-acetaminophen (NORCO/VICODIN) 5-325 MG tablet Take 1-2 tablets by mouth every 6 (six) hours as needed for severe pain. 04/21/17   Joy, Shawn C, PA-C  ibuprofen (ADVIL,MOTRIN) 600 MG tablet Take 1 tablet (600 mg total) by mouth every 6 (six) hours as needed. 04/21/17   Joy, Shawn C, PA-C  lidocaine (LIDODERM) 5 % Place 1 patch onto the skin daily. Remove & Discard patch within 12 hours or as directed by MD 04/21/17   Joy, Shawn C, PA-C  methocarbamol (ROBAXIN) 500 MG tablet Take 1 tablet (500 mg total) by mouth 2 (two) times daily. 04/21/17   Joy, Shawn C, PA-C  naproxen (NAPROSYN) 500 MG tablet Take 1 tablet (500 mg total) by mouth 2 (two) times daily as needed (for chest wall pain). 06/22/16   Molpus, John, MD  norgestimate-ethinyl estradiol (ORTHO-CYCLEN,SPRINTEC,PREVIFEM) 0.25-35 MG-MCG tablet Take 1 tablet by mouth daily. 05/16/16   Aviva Signs, CNM  ondansetron (ZOFRAN ODT) 4 MG disintegrating tablet Take 1-2 tablets (4-8 mg total) by  mouth every 8 (eight) hours as needed for nausea or vomiting. 01/09/17   Ward, Layla Maw, DO    Family History Family History  Problem Relation Age of Onset  . Diabetes Mother   . Heart disease Mother   . Cancer Mother        breast  . Hypertension Brother     Social History Social History  Substance Use Topics  . Smoking status: Never Smoker  . Smokeless tobacco: Never Used  . Alcohol use Yes     Comment: occ     Allergies   Patient has no known allergies.   Review of Systems Review of Systems  Constitutional: Negative for fever.  Genitourinary: Positive for vaginal bleeding. Negative for dyspareunia.  All other systems reviewed and are negative.    Physical  Exam Updated Vital Signs LMP 05/09/2017 (Approximate)   Physical Exam  Constitutional: She is oriented to person, place, and time. She appears well-developed and well-nourished. No distress.  HENT:  Head: Normocephalic and atraumatic.  Nose: Nose normal.  Mouth/Throat: No oropharyngeal exudate.  Eyes: Pupils are equal, round, and reactive to light. Conjunctivae are normal.  Neck: Normal range of motion. Neck supple.  Cardiovascular: Normal rate, regular rhythm, normal heart sounds and intact distal pulses.   Pulmonary/Chest: Effort normal and breath sounds normal.  Abdominal: Soft. Bowel sounds are normal. She exhibits no mass. There is no tenderness. There is no rebound and no guarding.  Genitourinary:  Genitourinary Comments: Chaperone present.  Os closed no CMT scant bleeding  Musculoskeletal: Normal range of motion.  Neurological: She is alert and oriented to person, place, and time.  Skin: Skin is warm and dry. Capillary refill takes less than 2 seconds.  Psychiatric: She has a normal mood and affect.     ED Treatments / Results  Labs (all labs ordered are listed, but only abnormal results are displayed) Results for orders placed or performed during the hospital encounter of 06/22/17  Wet prep, genital  Result Value Ref Range   Yeast Wet Prep HPF POC NONE SEEN NONE SEEN   Trich, Wet Prep PRESENT (A) NONE SEEN   Clue Cells Wet Prep HPF POC PRESENT (A) NONE SEEN   WBC, Wet Prep HPF POC MODERATE (A) NONE SEEN   Sperm NONE SEEN   CBC with Differential/Platelet  Result Value Ref Range   WBC 11.7 (H) 4.0 - 10.5 K/uL   RBC 4.16 3.87 - 5.11 MIL/uL   Hemoglobin 11.2 (L) 12.0 - 15.0 g/dL   HCT 92.3 (L) 30.0 - 76.2 %   MCV 81.0 78.0 - 100.0 fL   MCH 26.9 26.0 - 34.0 pg   MCHC 33.2 30.0 - 36.0 g/dL   RDW 26.3 33.5 - 45.6 %   Platelets 332 150 - 400 K/uL   Neutrophils Relative % 55 %   Neutro Abs 6.4 1.7 - 7.7 K/uL   Lymphocytes Relative 38 %   Lymphs Abs 4.5 (H) 0.7 - 4.0  K/uL   Monocytes Relative 5 %   Monocytes Absolute 0.6 0.1 - 1.0 K/uL   Eosinophils Relative 2 %   Eosinophils Absolute 0.3 0.0 - 0.7 K/uL   Basophils Relative 0 %   Basophils Absolute 0.0 0.0 - 0.1 K/uL  Basic metabolic panel  Result Value Ref Range   Sodium 139 135 - 145 mmol/L   Potassium 3.5 3.5 - 5.1 mmol/L   Chloride 105 101 - 111 mmol/L   CO2 25 22 - 32 mmol/L  Glucose, Bld 96 65 - 99 mg/dL   BUN 12 6 - 20 mg/dL   Creatinine, Ser 1.61 (H) 0.44 - 1.00 mg/dL   Calcium 8.7 (L) 8.9 - 10.3 mg/dL   GFR calc non Af Amer >60 >60 mL/min   GFR calc Af Amer >60 >60 mL/min   Anion gap 9 5 - 15  hCG, quantitative, pregnancy  Result Value Ref Range   hCG, Beta Chain, Quant, S <1 <5 mIU/mL  Urinalysis, Routine w reflex microscopic  Result Value Ref Range   Color, Urine YELLOW YELLOW   APPearance CLEAR CLEAR   Specific Gravity, Urine 1.012 1.005 - 1.030   pH 6.0 5.0 - 8.0   Glucose, UA NEGATIVE NEGATIVE mg/dL   Hgb urine dipstick LARGE (A) NEGATIVE   Bilirubin Urine NEGATIVE NEGATIVE   Ketones, ur NEGATIVE NEGATIVE mg/dL   Protein, ur NEGATIVE NEGATIVE mg/dL   Nitrite NEGATIVE NEGATIVE   Leukocytes, UA TRACE (A) NEGATIVE  Urinalysis, Microscopic (reflex)  Result Value Ref Range   RBC / HPF TOO NUMEROUS TO COUNT 0 - 5 RBC/hpf   WBC, UA 0-5 0 - 5 WBC/hpf   Bacteria, UA FEW (A) NONE SEEN   Squamous Epithelial / LPF 0-5 (A) NONE SEEN   Trichomonas, UA PRESENT    No results found.  Radiology No results found.  Procedures Procedures (including critical care time)  Medications Ordered in ED  Medications  metroNIDAZOLE (FLAGYL) tablet 2,000 mg (2,000 mg Oral Given 06/23/17 0115)  cefTRIAXone (ROCEPHIN) injection 250 mg (250 mg Intramuscular Given 06/23/17 0116)  azithromycin (ZITHROMAX) powder 1 g (1 g Oral Given 06/23/17 0115)   Case d/w Dr. Jolayne Panther by phone.  No indication for transfer this evening.  Will need outpatient ultrasound.  Treat for STI   Final Clinical  Impressions(s) / ED Diagnoses  Trichomoniasis:    The patient is very well appearing and has been observed in the ED.  Strict return precautions given for  intractable Headache, changes in vision or thinking, syncope, pelvic pain, chest pain, dyspnea on exertion, weakness, vomiting, diarrhea,  Inability to tolerate liquids or food, fevers > 101, rashes on the skin, altered mental status or any concerns. No signs of systemic illness or infection. The patient is nontoxic-appearing on exam and vital signs are within normal limits.  No sexual activity of any kind until 7 days after all partners treated.  Follow up with GYN for outpatient ultrasound.  Patient verbalizes understanding and agrees to follow up  I have reviewed the triage vital signs and the nursing notes. Pertinent labs &imaging results that were available during my care of the patient were reviewed by me and considered in my medical decision making (see chart for details).  After history, exam, and medical workup I feel the patient has been appropriately medically screened and is safe for discharge home. Pertinent diagnoses were discussed with the patient. Patient was given return precautions.    Marguis Mathieson, MD 06/23/17 [phone]

## 2017-06-24 LAB — GC/CHLAMYDIA PROBE AMP (~~LOC~~) NOT AT ARMC
Chlamydia: NEGATIVE
NEISSERIA GONORRHEA: NEGATIVE

## 2017-09-14 ENCOUNTER — Other Ambulatory Visit: Payer: Self-pay | Admitting: Advanced Practice Midwife

## 2017-09-14 DIAGNOSIS — Z3041 Encounter for surveillance of contraceptive pills: Secondary | ICD-10-CM

## 2017-09-30 ENCOUNTER — Other Ambulatory Visit: Payer: Self-pay

## 2017-09-30 ENCOUNTER — Encounter (HOSPITAL_BASED_OUTPATIENT_CLINIC_OR_DEPARTMENT_OTHER): Payer: Self-pay | Admitting: *Deleted

## 2017-09-30 ENCOUNTER — Emergency Department (HOSPITAL_BASED_OUTPATIENT_CLINIC_OR_DEPARTMENT_OTHER)
Admission: EM | Admit: 2017-09-30 | Discharge: 2017-09-30 | Disposition: A | Discharge status: Home / Self Care | Payer: BLUE CROSS/BLUE SHIELD | Attending: Emergency Medicine | Admitting: Emergency Medicine

## 2017-09-30 DIAGNOSIS — Z79899 Other long term (current) drug therapy: Secondary | ICD-10-CM | POA: Insufficient documentation

## 2017-09-30 DIAGNOSIS — R202 Paresthesia of skin: Secondary | ICD-10-CM | POA: Insufficient documentation

## 2017-09-30 LAB — CBG MONITORING, ED: Glucose-Capillary: 97 mg/dL (ref 65–99)

## 2017-09-30 NOTE — ED Provider Notes (Signed)
[facility] EMERGENCY DEPARTMENT Provider Note   CSN: [id number] Arrival date & time: 09/30/17  0007     History   Chief Complaint Chief Complaint  Patient presents with  . Tingling    HPI Chelsea Harper is a 36 y.o. female.  HPI Patient is a 36 year old female with no known past medical history presents the emergency department with intermittent initially and now constant tingling and pins and needle sensation in the dorsum of her left foot.  This is been present for approximately 2 months but seems to be more prominent now.  She denies low back pain or radiation of discomfort or numbness down her lower leg.  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  She wears new balance tennis shoes and reports new shoes in the past several months.  She has not seen her primary care physician nor has she seen a podiatrist at this point.  She denies weakness of her lower extremities.  No fevers or chills.  No rash.  Denies swelling of the left lower extremity as compared to the right   Past Medical History:  Diagnosis Date  . History of gestational diabetes 10/05/2014    [facility] Prenatal Labs  Dating LMP Blood type: O/POS/-- (12/01 1436)  Genetic Screen  Quad:           Normal 10/14/2014 Antibody:NEG (12/01 1436)  Anatomic US  1/8 nml with limited views of heart => [ ]  f/u Rubella: 0.91 (12/01 1436)  GTT Early:153.  3hr: 88, 149, 157, 117    Third trimester: 106, 180, 178, 161 RPR: NON REAC (02/04 1718)   TDaP vaccine April 2016 HBsAg: NEGATIVE (12/01 143  . History of gestational hypertension 02/28/2015    Patient Active Problem List   Diagnosis Date Noted  . History of gestational hypertension 02/28/2015  . AS (sickle cell trait) (HCC) 11/11/2014  . History of gestational diabetes 10/05/2014  . Depression, recurrent (HCC) 08/06/2014    Past Surgical History:  Procedure Laterality Date  . NO PAST SURGERIES      OB History    Gravida Para Term Preterm AB Living   2 1 1     1    SAB TAB Ectopic Multiple Live Births         0 1       Home Medications    Prior to Admission medications   Medication Sig Start Date End Date Taking? Authorizing Provider  dicyclomine (BENTYL) 20 MG tablet Take 1 tablet (20 mg total) by mouth every 8 (eight) hours as needed for spasms. 01/09/17   Ward, Layla MawKristen N, DO  HYDROcodone-acetaminophen (NORCO/VICODIN) 5-325 MG tablet Take 1-2 tablets by mouth every 6 (six) hours as needed for severe pain. 04/21/17   Joy, Shawn C, PA-C  ibuprofen (ADVIL,MOTRIN) 600 MG tablet Take 1 tablet (600 mg total) by mouth every 6 (six) hours as needed. 04/21/17   Joy, Shawn C, PA-C  lidocaine (LIDODERM) 5 % Place 1 patch onto the skin daily. Remove & Discard patch within 12 hours or as directed by MD 04/21/17   Joy, Shawn C, PA-C  methocarbamol (ROBAXIN) 500 MG tablet Take 1 tablet (500 mg total) by mouth 2 (two) times daily. 04/21/17   Joy, Shawn C, PA-C  naproxen (NAPROSYN) 500 MG tablet Take 1 tablet (500 mg total) by mouth 2 (two) times daily as needed (for chest wall pain). 06/22/16   Molpus, Jonny RuizJohn, MD  norgestimate-ethinyl estradiol (ORTHO-CYCLEN,SPRINTEC,PREVIFEM) 0.25-35 MG-MCG tablet Take 1 tablet by mouth daily. 05/16/16   Aviva SignsWilliams, [name] L, CNM  ondansetron (ZOFRAN ODT) 4 MG disintegrating tablet Take 1-2 tablets (4-8 mg total) by mouth every 8 (eight) hours as needed for nausea or vomiting. 01/09/17   Ward, Layla MawKristen N, DO    Family History Family History  Problem Relation Age of Onset  . Diabetes Mother   . Heart disease Mother   . Cancer Mother        breast  . Hypertension Brother     Social History Social History   Tobacco Use  . Smoking status: Never Smoker  . Smokeless tobacco: Never Used  Substance Use Topics  . Alcohol use: Yes    Comment: occ  . Drug use: No     Allergies   Patient has no known allergies.   Review of Systems Review of Systems  All other systems reviewed and  are negative.    Physical Exam Updated Vital Signs BP (!) 137/91 (BP Location: Left Arm)   Pulse 72   Temp 99.1 F (37.3 C) (Oral)   Resp 16   Ht 5' 6.5" (1.689 m)   Wt (!) 138.8 kg (306 lb)   LMP 08/24/2017 (Approximate)   SpO2 100%   Breastfeeding? No   BMI 48.65 kg/m   Physical Exam  Constitutional: She is oriented to person, place, and time. She appears well-developed and well-nourished.  HENT:  Head: Normocephalic.  Eyes: EOM are normal.  Neck: Normal range of motion.  Pulmonary/Chest: Effort normal.  Abdominal: She exhibits no distension.  Musculoskeletal: Normal range of motion.  No swelling of the left lower extremity as compared to the right.  Normal PT and DP pulse of the left foot.  Compartments of the left foot are soft.  No swelling of the dorsum of the left foot or left ankle.  Full range of motion of left hip, left knee, left ankle.  No overlying skin changes.  Neurological: She is alert and oriented to person, place, and time.  Psychiatric: She has a normal mood and affect.  Nursing note and vitals reviewed.    ED Treatments / Results  Labs (all labs ordered are listed, but only abnormal results are displayed) Labs Reviewed  CBG MONITORING, ED    EKG  EKG Interpretation None       Radiology No results found.  Procedures Procedures (including critical care time)  Medications Ordered in ED Medications - No data to display   Initial Impression / Assessment and Plan / ED Course  I have reviewed the triage vital signs and the nursing notes.  Pertinent labs & imaging results that were available during my care of the patient were reviewed by me and considered in my medical decision making (see chart for details).     Nonspecific paresthesias likely related to compression of a peripheral nerve.  No indication for additional workup in the emergency department.  She is already scheduled to see a primary care physician in the next several days  regarding this.  Blood sugar normal today.  Additional workup can be deferred to outpatient setting  Final Clinical Impressions(s) / ED Diagnoses   Final diagnoses:  Paresthesia of left foot    ED Discharge Orders    None       Azalia Bilisampos, [place], MD 09/30/17 [phone]

## 2017-09-30 NOTE — Discharge Instructions (Signed)
Please follow up with your physician this week as scheduled for additional evaluation and workup

## 2017-09-30 NOTE — ED Notes (Signed)
ED Provider at bedside. 

## 2017-09-30 NOTE — ED Triage Notes (Signed)
States one week of foot "tingling" and pain. Also states she has also been constipated since having a miscarriage in August

## 2018-01-20 ENCOUNTER — Emergency Department (HOSPITAL_BASED_OUTPATIENT_CLINIC_OR_DEPARTMENT_OTHER): Payer: Self-pay

## 2018-01-20 ENCOUNTER — Encounter (HOSPITAL_BASED_OUTPATIENT_CLINIC_OR_DEPARTMENT_OTHER): Payer: Self-pay | Admitting: Emergency Medicine

## 2018-01-20 ENCOUNTER — Emergency Department (HOSPITAL_BASED_OUTPATIENT_CLINIC_OR_DEPARTMENT_OTHER)
Admission: EM | Admit: 2018-01-20 | Discharge: 2018-01-20 | Disposition: A | Discharge status: Home / Self Care | Payer: Self-pay | Attending: Emergency Medicine | Admitting: Emergency Medicine

## 2018-01-20 DIAGNOSIS — Z79899 Other long term (current) drug therapy: Secondary | ICD-10-CM | POA: Insufficient documentation

## 2018-01-20 DIAGNOSIS — M79672 Pain in left foot: Secondary | ICD-10-CM | POA: Insufficient documentation

## 2018-01-20 MED ORDER — NAPROXEN 250 MG PO TABS
500.0000 mg | ORAL_TABLET | Freq: Once | ORAL | Status: AC
  Administered 2018-01-20: 500 mg via ORAL
  Filled 2018-01-20: qty 2

## 2018-01-20 MED ORDER — NAPROXEN 375 MG PO TABS: 375.0000 mg | ORAL_TABLET | Freq: Two times a day (BID) | ORAL | 0 refills | Status: DC

## 2018-01-20 MED FILL — NAPROXEN 375 MG TABS: 375 | 10 days supply | Qty: 20 | Fill #0

## 2018-01-20 NOTE — ED Triage Notes (Signed)
Left foot/ankle pain and swelling since Sunday.  Pt states pain was progressive, no injury noted.

## 2018-01-20 NOTE — ED Provider Notes (Signed)
[facility] EMERGENCY DEPARTMENT Provider Note   CSN: [id number] Arrival date & time: 01/20/18  1006     History   Chief Complaint Chief Complaint  Patient presents with  . Ankle Pain    HPI Chelsea Harper is a 37 y.o. female.  HPI 37 year old female with no pertinent past medical history presents to the emergency department today with complaints of left foot pain.  The patient states that she started developing some pain in her left foot yesterday.  She took some Tylenol with some relief.  Patient states that this morning when she woke up and got out of bed and put her left foot down she had a sharp pain in her left heel her left foot.  States the pain has been persistent since.  States that she had similar pain to this approximate 1 year ago when she was seen in the ED for same.  States that she needs to follow-up with a podiatrist.  Patient has not taking any medications for her symptoms.  She does report having history of sciatic nerve pain that she takes gabapentin for.  Denies any weakness or paresthesias in the left foot.  Denies any leg swelling or calf tenderness.  Denies any associated chest pain shortness of breath.  Denies any known injury. Past Medical History:  Diagnosis Date  . History of gestational diabetes 10/05/2014    [facility] Prenatal Labs  Dating LMP Blood type: O/POS/-- (12/01 1436)  Genetic Screen  Quad:           Normal 10/14/2014 Antibody:NEG (12/01 1436)  Anatomic US  1/8 nml with limited views of heart => [ ]  f/u Rubella: 0.91 (12/01 1436)  GTT Early:153.  3hr: 88, 149, 157, 117    Third trimester: 106, 180, 178, 161 RPR: NON REAC (02/04 1718)   TDaP vaccine April 2016 HBsAg: NEGATIVE (12/01 143  . History of gestational hypertension 02/28/2015    Patient Active Problem List   Diagnosis Date Noted  . History of gestational hypertension 02/28/2015  . AS (sickle cell trait) (HCC) 11/11/2014  . History of gestational diabetes 10/05/2014  .  Depression, recurrent (HCC) 08/06/2014    Past Surgical History:  Procedure Laterality Date  . NO PAST SURGERIES      OB History    Gravida Para Term Preterm AB Living   2 1 1     1    SAB TAB Ectopic Multiple Live Births         0 1       Home Medications    Prior to Admission medications   Medication Sig Start Date End Date Taking? Authorizing Provider  dicyclomine (BENTYL) 20 MG tablet Take 1 tablet (20 mg total) by mouth every 8 (eight) hours as needed for spasms. 01/09/17   Ward, Layla MawKristen N, DO  HYDROcodone-acetaminophen (NORCO/VICODIN) 5-325 MG tablet Take 1-2 tablets by mouth every 6 (six) hours as needed for severe pain. 04/21/17   Joy, Shawn C, PA-C  ibuprofen (ADVIL,MOTRIN) 600 MG tablet Take 1 tablet (600 mg total) by mouth every 6 (six) hours as needed. 04/21/17   Joy, Shawn C, PA-C  lidocaine (LIDODERM) 5 % Place 1 patch onto the skin daily. Remove & Discard patch within 12 hours or as directed by MD 04/21/17   Joy, Shawn C, PA-C  methocarbamol (ROBAXIN) 500 MG tablet Take 1 tablet (500 mg total) by mouth 2 (two) times daily. 04/21/17   Joy, Shawn C, PA-C  naproxen (NAPROSYN) 375 MG  tablet Take 1 tablet (375 mg total) by mouth 2 (two) times daily. 01/20/18   Rise Mu, PA-C  norgestimate-ethinyl estradiol (ORTHO-CYCLEN,SPRINTEC,PREVIFEM) 0.25-35 MG-MCG tablet Take 1 tablet by mouth daily. 05/16/16   Aviva Signs, CNM  ondansetron (ZOFRAN ODT) 4 MG disintegrating tablet Take 1-2 tablets (4-8 mg total) by mouth every 8 (eight) hours as needed for nausea or vomiting. 01/09/17   Ward, Layla Maw, DO  promethazine (PHENERGAN) 25 MG tablet Take 1 tablet (25 mg total) by mouth every 6 (six) hours as needed for nausea or vomiting. 08/14/15 05/04/16  Joycie Peek, PA-C    Family History Family History  Problem Relation Age of Onset  . Diabetes Mother   . Heart disease Mother   . Cancer Mother        breast  . Hypertension Brother     Social History Social History     Tobacco Use  . Smoking status: Never Smoker  . Smokeless tobacco: Never Used  Substance Use Topics  . Alcohol use: Yes    Comment: occ  . Drug use: No     Allergies   Patient has no known allergies.   Review of Systems Review of Systems  All other systems reviewed and are negative.    Physical Exam Updated Vital Signs BP 138/69 (BP Location: Right Arm)   Pulse 73   Temp 98.5 F (36.9 C) (Oral)   Resp 18   LMP 01/14/2018   SpO2 99%   Physical Exam  Constitutional: She appears well-developed and well-nourished. No distress.  HENT:  Head: Normocephalic and atraumatic.  Eyes: Right eye exhibits no discharge. Left eye exhibits no discharge. No scleral icterus.  Neck: Normal range of motion.  Pulmonary/Chest: No respiratory distress.  Musculoskeletal: Normal range of motion.  Minimal soft tissue swelling over the medial malleolus of the left foot.  Tender to palpation over the calcaneus and pain with range of motion of the left ankle.  There is no erythema or warmth of the joint that be concerning for septic arthritis.  DP pulses are 2+ bilaterally.  Brisk cap refill.  Sensation intact.  Negative Thompson's test.  Skin compartments are soft.  No lower extremity edema or calf tenderness.  Full range of motion of the left ankle.  Patient does have pain with hyperextension of the left first metatarsal and palpation of the plantar region.  Neurological: She is alert.  Skin: No pallor.  Psychiatric: Her behavior is normal. Judgment and thought content normal.  Nursing note and vitals reviewed.    ED Treatments / Results  Labs (all labs ordered are listed, but only abnormal results are displayed) Labs Reviewed - No data to display  EKG  EKG Interpretation None       Radiology Dg Ankle Complete Left  Result Date: 01/20/2018 CLINICAL DATA:  Pain EXAM: LEFT ANKLE COMPLETE - 3+ VIEW COMPARISON:  May 12, 2016 FINDINGS: Frontal, oblique, and lateral views were obtained.  No evident fracture or joint effusion. No appreciable joint space narrowing or erosion. The ankle mortise appears intact. IMPRESSION: No acute fracture. Ankle mortise appears intact. No underlying arthropathy apparent. Electronically Signed   By: Bretta Bang III M.D.   On: 01/20/2018 11:08   Dg Foot Complete Left  Result Date: 01/20/2018 CLINICAL DATA:  Pain EXAM: LEFT FOOT - COMPLETE 3+ VIEW COMPARISON:  January 19, 2016 FINDINGS: Frontal, oblique, and lateral views were obtained. There is no fracture or dislocation. There is hallux valgus deformity at the  first MTP joint. There is no appreciable joint space narrowing or erosion. There is mild generalized soft tissue swelling. IMPRESSION: Mild generalized soft tissue swelling persists. Hallux valgus deformity noted in first MTP joint. No fracture or dislocation. No appreciable joint space narrowing or erosion. Electronically Signed   By: Bretta Bang III M.D.   On: 01/20/2018 11:07    Procedures Procedures (including critical care time)  Medications Ordered in ED Medications  naproxen (NAPROSYN) tablet 500 mg (500 mg Oral Given 01/20/18 1136)     Initial Impression / Assessment and Plan / ED Course  I have reviewed the triage vital signs and the nursing notes.  Pertinent labs & imaging results that were available during my care of the patient were reviewed by me and considered in my medical decision making (see chart for details).     Presents to the ED with complaints of left foot pain.  History of same.  No known injury.  Patient neurovascularly intact.  X-rays show chronic change but no acute findings.  Patient has no lower extremity edema or calf tenderness.  Low suspicion for DVT at this time.  Patient symptoms seem consistent with plantar fasciitis versus Achilles tendinitis.  Discussed symptomatic treatment at home.  Patient has asked for podiatry follow-up.  Have provided.  Instructed patient to get heel lifts and arch  supports.  Pt is hemodynamically stable, in NAD, & able to ambulate in the ED. Evaluation does not show pathology that would require ongoing emergent intervention or inpatient treatment. I explained the diagnosis to the patient. Pain has been managed & has no complaints prior to dc. Pt is comfortable with above plan and is stable for discharge at this time. All questions were answered prior to disposition. Strict return precautions for f/u to the ED were discussed. Encouraged follow up with PCP.   Final Clinical Impressions(s) / ED Diagnoses   Final diagnoses:  Left foot pain    ED Discharge Orders        Ordered    naproxen (NAPROSYN) 375 MG tablet  2 times daily     01/20/18 1155       Wallace Keller 01/20/18 1203    Gwyneth Sprout, MD 01/21/18 1955

## 2018-01-20 NOTE — Discharge Instructions (Signed)
Your x-ray was reassuring.  Your pain is likely related to Achilles tendinitis first plantar fasciitis.  Please take the Naproxen as prescribed for pain. Do not take any additional NSAIDs including Motrin, Aleve, Ibuprofen, Advil. Have been given your first dose in the Ed today.  Would also encourage warm soaks and Epsom salt.  Perform the stretches as prescribed.  Also obtain heel lifts and arch support.  Follow-up with podiatry return to ED with any worsening symptoms.

## 2018-05-21 ENCOUNTER — Encounter (HOSPITAL_BASED_OUTPATIENT_CLINIC_OR_DEPARTMENT_OTHER): Payer: Self-pay

## 2018-05-21 ENCOUNTER — Emergency Department (HOSPITAL_BASED_OUTPATIENT_CLINIC_OR_DEPARTMENT_OTHER)
Admission: EM | Admit: 2018-05-21 | Discharge: 2018-05-21 | Disposition: A | Discharge status: Home / Self Care | Payer: Managed Care, Other (non HMO) | Attending: Emergency Medicine | Admitting: Emergency Medicine

## 2018-05-21 ENCOUNTER — Other Ambulatory Visit: Payer: Self-pay

## 2018-05-21 DIAGNOSIS — H02885 Meibomian gland dysfunction left lower eyelid: Secondary | ICD-10-CM | POA: Insufficient documentation

## 2018-05-21 DIAGNOSIS — H00026 Hordeolum internum left eye, unspecified eyelid: Secondary | ICD-10-CM

## 2018-05-21 DIAGNOSIS — H5712 Ocular pain, left eye: Secondary | ICD-10-CM | POA: Diagnosis present

## 2018-05-21 DIAGNOSIS — Z79899 Other long term (current) drug therapy: Secondary | ICD-10-CM | POA: Insufficient documentation

## 2018-05-21 MED ORDER — FLUORESCEIN SODIUM 1 MG OP STRP
1.0000 | ORAL_STRIP | Freq: Once | OPHTHALMIC | Status: AC
  Administered 2018-05-21: 1 via OPHTHALMIC
  Filled 2018-05-21: qty 1

## 2018-05-21 MED ORDER — AMOXICILLIN-POT CLAVULANATE 875-125 MG PO TABS: 1.0000 | ORAL_TABLET | Freq: Two times a day (BID) | ORAL | 0 refills | Status: DC

## 2018-05-21 MED ORDER — TETRACAINE HCL 0.5 % OP SOLN
2.0000 [drp] | Freq: Once | OPHTHALMIC | Status: AC
  Administered 2018-05-21: 2 [drp] via OPHTHALMIC
  Filled 2018-05-21: qty 4

## 2018-05-21 MED ORDER — ERYTHROMYCIN 5 MG/GM OP OINT: TOPICAL_OINTMENT | OPHTHALMIC | 0 refills | Status: DC

## 2018-05-21 NOTE — ED Triage Notes (Signed)
Pt c/o pain to left eye-self treated stye 3 days ago that was better-today having pain to left side of face and cont'd pain/redness to left eye-denies injury-NAD-steady gait

## 2018-05-21 NOTE — ED Provider Notes (Signed)
[facility] EMERGENCY DEPARTMENT Provider Note   CSN: [id number] Arrival date & time: 05/21/18  1648     History   Chief Complaint Chief Complaint  Patient presents with  . Eye Pain    HPI Chelsea Harper is a 37 y.o. female who presents the emergency department chief complaint of left eyelid pain and swelling.  Patient states that she noticed what looked like a stye on the left eye yesterday.  Now she is having increasing redness and pain in the lower lid.  She states that she is having surrounding pain in the entire right orbit but denies pain with eye movement, changes in vision, lash mattering, itching, watering photophobia.  HPI  Past Medical History:  Diagnosis Date  . History of gestational diabetes 10/05/2014    [facility]inic [facility] Prenatal Labs  Dating LMP Blood type: O/POS/-- (12/01 1436)  Genetic Screen  Quad:           Normal 10/14/2014 Antibody:NEG (12/01 1436)  Anatomic [place]  1/8 nml with limited views of heart => [ ]  f/u Rubella: 0.91 (12/01 1436)  GTT Early:153.  3hr: 88, 149, 157, 117    Third trimester: 106, 180, 178, 161 RPR: NON REAC (02/04 1718)   TDaP vaccine April 2016 HBsAg: NEGATIVE (12/01 143  . History of gestational hypertension 02/28/2015    Patient Active Problem List   Diagnosis Date Noted  . History of gestational hypertension 02/28/2015  . AS (sickle cell trait) (HCC) 11/11/2014  . History of gestational diabetes 10/05/2014  . Depression, recurrent (HCC) 08/06/2014    Past Surgical History:  Procedure Laterality Date  . NO PAST SURGERIES       OB History    Gravida  2   Para  1   Term  1   Preterm      AB      Living  1     SAB      TAB      Ectopic      Multiple  0   Live Births  1            Home Medications    Prior to Admission medications   Medication Sig Start Date End Date Taking? Authorizing Provider  dicyclomine (BENTYL) 20 MG tablet Take 1 tablet (20 mg total) by mouth every 8 (eight) hours as  needed for spasms. 01/09/17   Ward, Layla Maw, DO  HYDROcodone-acetaminophen (NORCO/VICODIN) 5-325 MG tablet Take 1-2 tablets by mouth every 6 (six) hours as needed for severe pain. 04/21/17   Joy, Shawn C, PA-C  ibuprofen (ADVIL,MOTRIN) 600 MG tablet Take 1 tablet (600 mg total) by mouth every 6 (six) hours as needed. 04/21/17   Joy, Shawn C, PA-C  lidocaine (LIDODERM) 5 % Place 1 patch onto the skin daily. Remove & Discard patch within 12 hours or as directed by MD 04/21/17   Joy, Shawn C, PA-C  methocarbamol (ROBAXIN) 500 MG tablet Take 1 tablet (500 mg total) by mouth 2 (two) times daily. 04/21/17   Joy, Shawn C, PA-C  naproxen (NAPROSYN) 375 MG tablet Take 1 tablet (375 mg total) by mouth 2 (two) times daily. 01/20/18   Rise Mu, PA-C  norgestimate-ethinyl estradiol (ORTHO-CYCLEN,SPRINTEC,PREVIFEM) 0.25-35 MG-MCG tablet Take 1 tablet by mouth daily. 05/16/16   Aviva Signs, CNM  ondansetron (ZOFRAN ODT) 4 MG disintegrating tablet Take 1-2 tablets (4-8 mg total) by mouth every 8 (eight) hours as needed for nausea or vomiting. 01/09/17  Ward, Layla MawKristen N, DO    Family History Family History  Problem Relation Age of Onset  . Diabetes Mother   . Heart disease Mother   . Cancer Mother        breast  . Hypertension Brother     Social History Social History   Tobacco Use  . Smoking status: Never Smoker  . Smokeless tobacco: Never Used  Substance Use Topics  . Alcohol use: Yes    Comment: occ  . Drug use: No     Allergies   Patient has no known allergies.   Review of Systems Review of Systems  Ten systems reviewed and are negative for acute change, except as noted in the HPI.   Physical Exam Updated Vital Signs BP (!) 141/78 (BP Location: Left Arm)   Pulse 78   Temp 99.3 F (37.4 C) (Oral)   Resp 20   Ht 5\' 6"  (1.676 m)   Wt 133.8 kg (295 lb)   LMP 05/01/2018   SpO2 98%   BMI 47.61 kg/m   Physical Exam  Constitutional: She is oriented to person, place, and  time. She appears well-developed and well-nourished. No distress.  HENT:  Head: Normocephalic and atraumatic.  Eyes: Conjunctivae are normal. No scleral icterus.    Neck: Normal range of motion.  Cardiovascular: Normal rate, regular rhythm and normal heart sounds. Exam reveals no gallop and no friction rub.  No murmur heard. Pulmonary/Chest: Effort normal and breath sounds normal. No respiratory distress.  Abdominal: Soft. Bowel sounds are normal. She exhibits no distension and no mass. There is no tenderness. There is no guarding.  Neurological: She is alert and oriented to person, place, and time.  Skin: Skin is warm and dry. She is not diaphoretic.  Psychiatric: Her behavior is normal.  Nursing note and vitals reviewed.    ED Treatments / Results  Labs (all labs ordered are listed, but only abnormal results are displayed) Labs Reviewed - No data to display  EKG None  Radiology No results found.  Procedures Procedures (including critical care time)  Medications Ordered in ED Medications  tetracaine (PONTOCAINE) 0.5 % ophthalmic solution 2 drop (2 drops Left Eye Given by Other 05/21/18 1723)  fluorescein ophthalmic strip 1 strip (1 strip Left Eye Given by Other 05/21/18 1723)     Initial Impression / Assessment and Plan / ED Course  I have reviewed the triage vital signs and the nursing notes.  Pertinent labs & imaging results that were available during my care of the patient were reviewed by me and considered in my medical decision making (see chart for details).     .  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  Patient will be treated with both erythromycin ointment and Augmentin.  Patient is advised to follow closely with ophthalmology in the next 2 to 3 days.  Advised also to apply warm compresses.  If no concern for intraocular etiology of her complaint.  She is afebrile and appears appropriate for discharge at this time.  Final Clinical Impressions(s)  / ED Diagnoses   Final diagnoses:  None    ED Discharge Orders    None       Arthor CaptainHarris, Salaya Holtrop, PA-C 05/21/18 1911    Jacalyn LefevreHaviland, Julie, MD 05/21/18 1912

## 2018-05-21 NOTE — ED Notes (Signed)
Pt verbalizes understanding of d/c instructions and denies any further need at this time. 

## 2018-05-21 NOTE — Discharge Instructions (Signed)
Contact a health care provider if: Your eyelid has not improved in 4 weeks. Your eyelid is getting worse. You have a fever. The chalazion does not rupture on its own with home treatment in a month. Get help right away if: You have pain in your eye. Your vision changes. The chalazion becomes painful or red The chalazion gets bigger.

## 2018-12-02 ENCOUNTER — Encounter (HOSPITAL_BASED_OUTPATIENT_CLINIC_OR_DEPARTMENT_OTHER): Payer: Self-pay

## 2018-12-02 ENCOUNTER — Other Ambulatory Visit: Payer: Self-pay

## 2018-12-02 ENCOUNTER — Emergency Department (HOSPITAL_BASED_OUTPATIENT_CLINIC_OR_DEPARTMENT_OTHER)
Admission: EM | Admit: 2018-12-02 | Discharge: 2018-12-02 | Disposition: A | Discharge status: Home / Self Care | Payer: Managed Care, Other (non HMO) | Attending: Emergency Medicine | Admitting: Emergency Medicine

## 2018-12-02 ENCOUNTER — Emergency Department (HOSPITAL_BASED_OUTPATIENT_CLINIC_OR_DEPARTMENT_OTHER): Payer: Managed Care, Other (non HMO)

## 2018-12-02 DIAGNOSIS — Z79899 Other long term (current) drug therapy: Secondary | ICD-10-CM | POA: Diagnosis not present

## 2018-12-02 DIAGNOSIS — J111 Influenza due to unidentified influenza virus with other respiratory manifestations: Secondary | ICD-10-CM | POA: Insufficient documentation

## 2018-12-02 DIAGNOSIS — R6889 Other general symptoms and signs: Secondary | ICD-10-CM

## 2018-12-02 DIAGNOSIS — R5383 Other fatigue: Secondary | ICD-10-CM | POA: Diagnosis not present

## 2018-12-02 DIAGNOSIS — R51 Headache: Secondary | ICD-10-CM | POA: Diagnosis not present

## 2018-12-02 DIAGNOSIS — Z8632 Personal history of gestational diabetes: Secondary | ICD-10-CM | POA: Insufficient documentation

## 2018-12-02 DIAGNOSIS — M7918 Myalgia, other site: Secondary | ICD-10-CM | POA: Diagnosis not present

## 2018-12-02 DIAGNOSIS — R05 Cough: Secondary | ICD-10-CM | POA: Diagnosis present

## 2018-12-02 DIAGNOSIS — R509 Fever, unspecified: Secondary | ICD-10-CM | POA: Diagnosis not present

## 2018-12-02 MED ORDER — IBUPROFEN 400 MG PO TABS
400.0000 mg | ORAL_TABLET | Freq: Once | ORAL | Status: AC
  Administered 2018-12-02: 400 mg via ORAL
  Filled 2018-12-02: qty 1

## 2018-12-02 NOTE — ED Notes (Signed)
ED Provider at bedside. 

## 2018-12-02 NOTE — ED Triage Notes (Addendum)
Pt c/o cough, fever, body aches, fatigue x 2 days. Pt last had APAP at 23:00

## 2018-12-02 NOTE — ED Provider Notes (Signed)
[facility] EMERGENCY DEPARTMENT Provider Note   CSN: [id number] Arrival date & time: 12/02/18  0019     History   Chief Complaint Chief Complaint  Patient presents with  . Influenza    HPI Chelsea Harper is a 38 y.o. female.  The history is provided by the patient.  Influenza  Presenting symptoms: cough, fatigue, fever, headache, myalgias and sore throat   Presenting symptoms: no shortness of breath   Severity:  Moderate Onset quality:  Gradual Duration:  7 days Progression:  Worsening Chronicity:  New Worsened by:  Nothing Associated symptoms: chills    Patient reports onset of cough and flulike symptoms over 7 days ago.  She thought it was improving, but over 48 hours ago it worsened.  She reports fever, cough, sore throat. No chest pain or shortness of breath. No travel, she is a non-smoker Past Medical History:  Diagnosis Date  . History of gestational diabetes 10/05/2014    [facility] Prenatal Labs  Dating LMP Blood type: O/POS/-- (12/01 1436)  Genetic Screen  Quad:           Normal 10/14/2014 Antibody:NEG (12/01 1436)  Anatomic [place]  1/8 nml with limited views of heart => [ ]  f/u Rubella: 0.91 (12/01 1436)  GTT Early:153.  3hr: 88, 149, 157, 117    Third trimester: 106, 180, 178, 161 RPR: NON REAC (02/04 1718)   TDaP vaccine April 2016 HBsAg: NEGATIVE (12/01 143  . History of gestational hypertension 02/28/2015    Patient Active Problem List   Diagnosis Date Noted  . History of gestational hypertension 02/28/2015  . AS (sickle cell trait) (HCC) 11/11/2014  . History of gestational diabetes 10/05/2014  . Depression, recurrent (HCC) 08/06/2014    Past Surgical History:  Procedure Laterality Date  . NO PAST SURGERIES       OB History    Gravida  2   Para  1   Term  1   Preterm      AB      Living  1     SAB      TAB      Ectopic      Multiple  0   Live Births  1            Home Medications    Prior to Admission  medications   Medication Sig Start Date End Date Taking? Authorizing Provider  buPROPion (WELLBUTRIN XL) 150 MG 24 hr tablet Take 150 mg by mouth daily.   Yes [provider]  gabapentin (NEURONTIN) 300 MG capsule Take 300 mg by mouth daily.   Yes [provider]  norgestimate-ethinyl estradiol (ORTHO-CYCLEN,SPRINTEC,PREVIFEM) 0.25-35 MG-MCG tablet Take 1 tablet by mouth daily. 05/16/16  Yes Aviva Signs, CNM  ondansetron (ZOFRAN ODT) 4 MG disintegrating tablet Take 1-2 tablets (4-8 mg total) by mouth every 8 (eight) hours as needed for nausea or vomiting. 01/09/17   Ward, Layla Maw, DO    Family History Family History  Problem Relation Age of Onset  . Diabetes Mother   . Heart disease Mother   . Cancer Mother        breast  . Hypertension Brother     Social History Social History   Tobacco Use  . Smoking status: Never Smoker  . Smokeless tobacco: Never Used  Substance Use Topics  . Alcohol use: Yes    Comment: occ  . Drug use: No     Allergies   Patient has  no known allergies.   Review of Systems Review of Systems  Constitutional: Positive for chills, fatigue and fever.  HENT: Positive for sore throat.   Respiratory: Positive for cough. Negative for shortness of breath.   Musculoskeletal: Positive for myalgias.  Neurological: Positive for headaches.  All other systems reviewed and are negative.    Physical Exam Updated Vital Signs BP 129/85 (BP Location: Left Arm)   Pulse 98   Temp 100 F (37.8 C) (Oral)   Resp 20   Ht 1.689 m (5' 6.5")   Wt 136.1 kg   LMP 11/22/2018   SpO2 97%   BMI 47.70 kg/m   Physical Exam CONSTITUTIONAL: Well developed/well nourished HEAD: Normocephalic/atraumatic EYES: EOMI/PERRL ENMT: Mucous membranes moist NECK: supple no meningeal signs SPINE/BACK:entire spine nontender CV: S1/S2 noted, no murmurs/rubs/gallops noted LUNGS: Decreased breath sounds bilaterally, limited due to body size, no  distress ABDOMEN: soft, nontender NEURO: Pt is awake/alert/appropriate, moves all extremitiesx4.  No facial droop.   EXTREMITIES: pulses normal/equal, full ROM SKIN: warm, color normal PSYCH: no abnormalities of mood noted, alert and oriented to situation   ED Treatments / Results  Labs (all labs ordered are listed, but only abnormal results are displayed) Labs Reviewed - No data to display  EKG None  Radiology Dg Chest 2 View  Result Date: 12/02/2018 CLINICAL DATA:  Cough, fever, body aches. EXAM: CHEST - 2 VIEW COMPARISON:  None. FINDINGS: The cardiomediastinal contours are normal. Mild bronchial thickening. Pulmonary vasculature is normal. No consolidation, pleural effusion, or pneumothorax. No acute osseous abnormalities are seen. IMPRESSION: Mild bronchial thickening without pneumonia. Electronically Signed   By: Narda Rutherford M.D.   On: 12/02/2018 02:10    Procedures Procedures  Medications Ordered in ED Medications  ibuprofen (ADVIL,MOTRIN) tablet 400 mg (400 mg Oral Given 12/02/18 0139)     Initial Impression / Assessment and Plan / ED Course  I have reviewed the triage vital signs and the nursing notes.  Pertinent  imaging results that were available during my care of the patient were reviewed by me and considered in my medical decision making (see chart for details).     2:23 AM This Is probable influenza.  Patient was well-appearing. She is appropriate for outpatient management We discussed return precautions  Final Clinical Impressions(s) / ED Diagnoses   Final diagnoses:  Flu-like symptoms    ED Discharge Orders    None       Zadie Rhine, MD 12/02/18 [phone]

## 2018-12-02 NOTE — ED Notes (Signed)
Patient transported to X-ray 

## 2019-03-06 ENCOUNTER — Emergency Department (HOSPITAL_BASED_OUTPATIENT_CLINIC_OR_DEPARTMENT_OTHER): Payer: Managed Care, Other (non HMO)

## 2019-03-06 ENCOUNTER — Other Ambulatory Visit: Payer: Self-pay

## 2019-03-06 ENCOUNTER — Emergency Department (HOSPITAL_BASED_OUTPATIENT_CLINIC_OR_DEPARTMENT_OTHER)
Admission: EM | Admit: 2019-03-06 | Discharge: 2019-03-07 | Disposition: A | Discharge status: Home / Self Care | Payer: Managed Care, Other (non HMO) | Attending: Emergency Medicine | Admitting: Emergency Medicine

## 2019-03-06 ENCOUNTER — Encounter (HOSPITAL_BASED_OUTPATIENT_CLINIC_OR_DEPARTMENT_OTHER): Payer: Self-pay | Admitting: *Deleted

## 2019-03-06 DIAGNOSIS — Z79899 Other long term (current) drug therapy: Secondary | ICD-10-CM | POA: Diagnosis not present

## 2019-03-06 DIAGNOSIS — R51 Headache: Secondary | ICD-10-CM | POA: Insufficient documentation

## 2019-03-06 DIAGNOSIS — R519 Headache, unspecified: Secondary | ICD-10-CM

## 2019-03-06 LAB — PREGNANCY, URINE: Preg Test, Ur: NEGATIVE

## 2019-03-06 MED ORDER — METOCLOPRAMIDE HCL 10 MG PO TABS
10.0000 mg | ORAL_TABLET | Freq: Once | ORAL | Status: AC
  Administered 2019-03-06: 10 mg via ORAL
  Filled 2019-03-06: qty 1

## 2019-03-06 MED ORDER — DIPHENHYDRAMINE HCL 25 MG PO CAPS
25.0000 mg | ORAL_CAPSULE | Freq: Once | ORAL | Status: AC
  Administered 2019-03-06: 25 mg via ORAL
  Filled 2019-03-06: qty 1

## 2019-03-06 NOTE — ED Provider Notes (Signed)
[facility] EMERGENCY DEPARTMENT Provider Note   CSN: [id number] Arrival date & time: 03/06/19  2149    History   Chief Complaint Chief Complaint  Patient presents with  . Headache  . Neck Pain    HPI Chelsea Harper is a 38 y.o. female.     38 y.o female with a PMH of AS, Depression, gestational HTN presents to the ED with a chief complaint headache since this morning. Patient reports a gradual onset of intermittent left pain occipital pain with radiation to the front of her face. She reports there is a tingling sensation to her left cheek, eye, along her left neck. She reports "I feel like something is not right". Patient has tried taking tylenol which helped with symptoms. However, after picking up her daughter tonight patients states her headache returned. She does not suffer from migraines.Patient reports "I been a lot of stress lately". Her neck pain is located on the left side of the back of her neck, worse with palpation. She denies any fever, chiropractor manipulation, vision changes, nausea, vomiting or weakness.      Past Medical History:  Diagnosis Date  . History of gestational diabetes 10/05/2014    [facility]inic [facility] Prenatal Labs  Dating LMP Blood type: O/POS/-- (12/01 1436)  Genetic Screen  Quad:           Normal 10/14/2014 Antibody:NEG (12/01 1436)  Anatomic [place]  1/8 nml with limited views of heart =>  f/u Rubella: 0.91 (12/01 1436)  GTT Early:153.  3hr: 88, 149, 157, 117    Third trimester: 106, 180, 178, 161 RPR: NON REAC (02/04 1718)   TDaP vaccine April 2016 HBsAg: NEGATIVE (12/01 143  . History of gestational hypertension 02/28/2015    Patient Active Problem List   Diagnosis Date Noted  . History of gestational hypertension 02/28/2015  . AS (sickle cell trait) (HCC) 11/11/2014  . History of gestational diabetes 10/05/2014  . Depression, recurrent (HCC) 08/06/2014    Past Surgical History:  Procedure Laterality Date  . NO PAST SURGERIES        OB History    Gravida  2   Para  1   Term  1   Preterm      AB      Living  1     SAB      TAB      Ectopic      Multiple  0   Live Births  1            Home Medications    Prior to Admission medications   Medication Sig Start Date End Date Taking? Authorizing Provider  buPROPion (WELLBUTRIN XL) 150 MG 24 hr tablet Take 150 mg by mouth daily.   Yes [provider]  gabapentin (NEURONTIN) 300 MG capsule Take 300 mg by mouth daily.   Yes [provider]  norgestimate-ethinyl estradiol (ORTHO-CYCLEN,SPRINTEC,PREVIFEM) 0.25-35 MG-MCG tablet Take 1 tablet by mouth daily. 05/16/16  Yes Aviva Signs, CNM  ondansetron (ZOFRAN ODT) 4 MG disintegrating tablet Take 1-2 tablets (4-8 mg total) by mouth every 8 (eight) hours as needed for nausea or vomiting. 01/09/17   Ward, Layla Maw, DO    Family History Family History  Problem Relation Age of Onset  . Diabetes Mother   . Heart disease Mother   . Cancer Mother        breast  . Hypertension Brother     Social History Social History   Tobacco  Use  . Smoking status: Never Smoker  . Smokeless tobacco: Never Used  Substance Use Topics  . Alcohol use: Yes    Comment: occ  . Drug use: No     Allergies   Patient has no known allergies.   Review of Systems Review of Systems  Constitutional: Negative for chills and fever.  HENT: Negative for ear pain and sore throat.   Eyes: Negative for pain and visual disturbance.  Respiratory: Negative for cough and shortness of breath.   Cardiovascular: Negative for chest pain and palpitations.  Gastrointestinal: Negative for abdominal pain and vomiting.  Genitourinary: Negative for dysuria and hematuria.  Musculoskeletal: Positive for neck pain. Negative for arthralgias, back pain and neck stiffness.  Skin: Negative for color change and rash.  Neurological: Positive for headaches. Negative for seizures and syncope.  All other systems reviewed and are  negative.    Physical Exam Updated Vital Signs BP 122/66   Pulse 78   Temp 98.2 F (36.8 C) (Oral)   Resp 20   Ht 5' 6.5" (1.689 m)   Wt 131.5 kg   LMP 03/06/2019   SpO2 100%   BMI 46.11 kg/m   Physical Exam Vitals signs and nursing note reviewed.  Constitutional:      General: She is not in acute distress.    Appearance: She is well-developed.  HENT:     Head: Normocephalic and atraumatic.     Mouth/Throat:     Pharynx: No oropharyngeal exudate.  Eyes:     Pupils: Pupils are equal, round, and reactive to light.  Neck:     Musculoskeletal: Normal range of motion. Normal range of motion. Pain with movement present. No erythema, neck rigidity or crepitus.     Thyroid: No thyromegaly.     Vascular: Normal carotid pulses. No carotid bruit.     Trachea: Trachea and phonation normal.     Meningeal: Brudzinski's sign and Kernig's sign absent.   Cardiovascular:     Rate and Rhythm: Regular rhythm.     Heart sounds: Normal heart sounds.  Pulmonary:     Effort: Pulmonary effort is normal. No respiratory distress.     Breath sounds: Normal breath sounds.  Abdominal:     General: Bowel sounds are normal. There is no distension.     Palpations: Abdomen is soft.     Tenderness: There is no abdominal tenderness.  Musculoskeletal:        General: No tenderness or deformity.     Right lower leg: No edema.     Left lower leg: No edema.  Lymphadenopathy:     Cervical:     Right cervical: No superficial cervical adenopathy. Skin:    General: Skin is warm and dry.  Neurological:     Mental Status: She is alert and oriented to person, place, and time.     Comments: Alert, oriented, thought content appropriate. Speech fluent without evidence of aphasia. Able to follow 2 step commands without difficulty.  Cranial Nerves:  II:  Peripheral visual fields grossly normal, pupils, round, reactive to light III,IV, VI: ptosis not present, extra-ocular motions intact bilaterally  V,VII:  smile symmetric, facial light touch sensation equal VIII: hearing grossly normal bilaterally  IX,X: midline uvula rise  XI: bilateral shoulder shrug equal and strong XII: midline tongue extension  Motor:  5/5 in upper and lower extremities bilaterally including strong and equal grip strength and dorsiflexion/plantar flexion Sensory: light touch normal in all extremities.  Cerebellar: normal finger-to-nose with  bilateral upper extremities, pronator drift negative Gait: normal gait and balance       ED Treatments / Results  Labs (all labs ordered are listed, but only abnormal results are displayed) Labs Reviewed  PREGNANCY, URINE  POC URINE PREG, ED    EKG None  Radiology Ct Head Wo Contrast  Result Date: 03/06/2019 CLINICAL DATA:  Headache EXAM: CT HEAD WITHOUT CONTRAST TECHNIQUE: Contiguous axial images were obtained from the base of the skull through the vertex without intravenous contrast. COMPARISON:  None. FINDINGS: Brain: No acute intracranial abnormality. Specifically, no hemorrhage, hydrocephalus, mass lesion, acute infarction, or significant intracranial injury. Vascular: No hyperdense vessel or unexpected calcification. Skull: No acute calvarial abnormality. Sinuses/Orbits: Visualized paranasal sinuses and mastoids clear. Orbital soft tissues unremarkable. Other: None IMPRESSION: Normal study. Electronically Signed   By: Charlett Nose M.D.   On: 03/06/2019 23:18    Procedures Procedures (including critical care time)  Medications Ordered in ED Medications  diphenhydrAMINE (BENADRYL) capsule 25 mg (25 mg Oral Given 03/06/19 2227)  metoCLOPramide (REGLAN) tablet 10 mg (10 mg Oral Given 03/06/19 2227)     Initial Impression / Assessment and Plan / ED Course  I have reviewed the triage vital signs and the nursing notes.  Pertinent labs & imaging results that were available during my care of the patient were reviewed by me and considered in my medical decision making (see  chart for details).     Patient with no PMH presents to the ED with a chief complaint of headache x today. Reports taking tylenol with no relieve in symptoms. Patient states she doe snot suffer from migraines but has been under a lot of stress lately. During evaluation, patient is well appearing, lights are on in the room, does not voice photophobia. No facial asymmetry, no dysarthria, no vomiting or red flags.   During evaluation patient is neurologically intact, no weakness on bilateral upper extremities.  Sensation is intact, tested along V1, V2, V3 intact.  She denies any blurry vision, vision changes, low suspicion for dissection. No trauma, nausea, vomiting low suspicion for any intracranial pathology.  Due to patient's description of the headache suspect this is likely a tension headache, she reports she has been having more stress lately.  Will attempt headache cocktail.  Shared decision conversation with patient about CT imaging, CT imaging will be ordered per patient's request.  CT head showed no intracranial hemorrhage, infarct, acute process at this time.  Patient was given Reglan, Benadryl to help with her symptoms.  11:35 PM patient was informed of her CT results, headache cocktail was given, she reports mild improvement in symptoms.  Patient is currently in room texting on her cell phone sitting in chair.  Patient also reports that she does not feel good and is working all weekend, she would like a note to miss work.  Have advised patient I can provide a note for Saturday to return on Sunday.  She is advised to return to the ED if she experiences any changes in vision, fever, worsening symptoms.  Urine pregnancy was also negative.  Patient understands and agrees with management at this point.  Return precautions provided.   Portions of this note were generated with Scientist, clinical (histocompatibility and immunogenetics). Dictation errors may occur despite best attempts at proofreading.    Final Clinical  Impressions(s) / ED Diagnoses   Final diagnoses:  Bad headache    ED Discharge Orders    None       Claude Manges, [place] 03/06/19  [id number]    Rolan BuccoBelfi, Melanie, MD 03/09/19 1621

## 2019-03-06 NOTE — ED Triage Notes (Signed)
Head and neck pain since this am. Pain has been off and on all day. The left side of her face has felt numb for the past hour.

## 2019-03-06 NOTE — Discharge Instructions (Addendum)
Your CT Head today was normal. You may take tylenol for your headache. If you experience any changes in vision, fever, worsening symptoms please return to the ED.

## 2020-02-01 IMAGING — CT CT HEAD WITHOUT CONTRAST
3 series · 15 of 47 positions shown, 18 images · non-contrast
Comparison: None.

CLINICAL DATA: Headache

EXAM:
CT HEAD WITHOUT CONTRAST
TECHNIQUE: Contiguous axial images were obtained from the base of the skull
through the vertex without intravenous contrast.

[Series 2: head wo · axial · 0.42mm/px · z∈[+1042,+1167]mm · 9 of 30 slices shown, 12 images]
[im 3/30  brain]
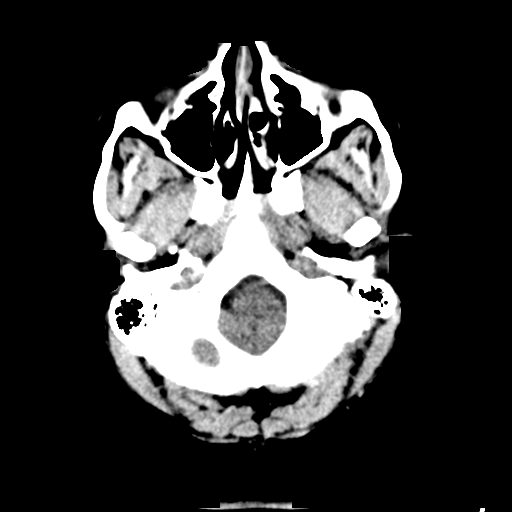
[im 3/30  bone]
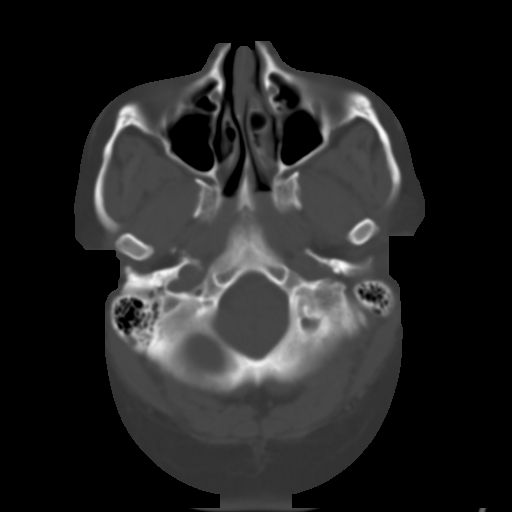
[im 6/30  brain]
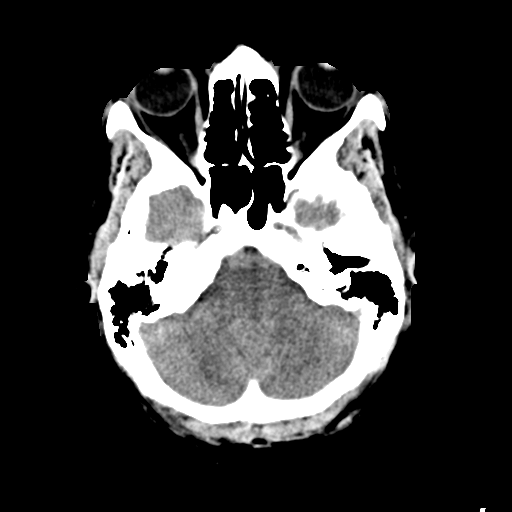
[im 9/30  brain]
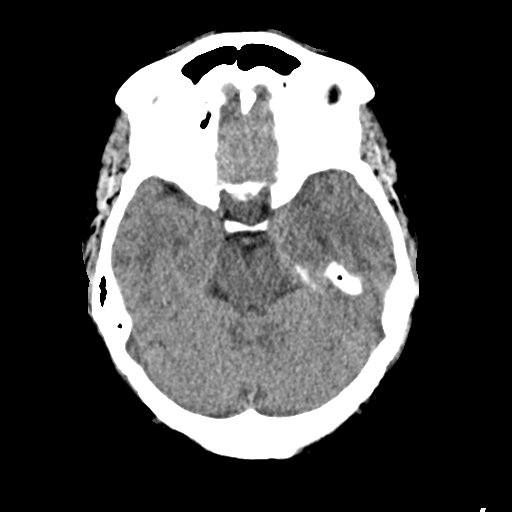
[im 12/30  brain]
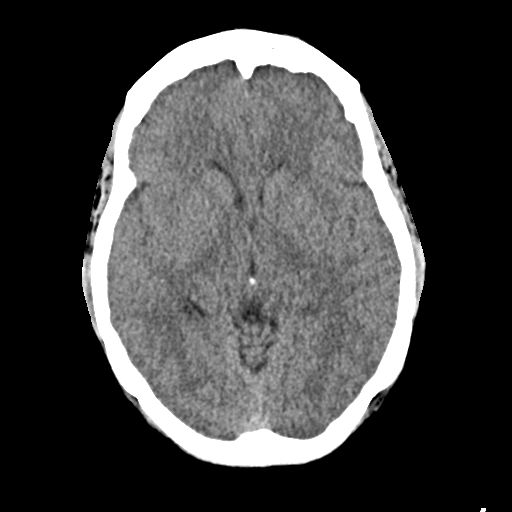
[im 16/30  brain]
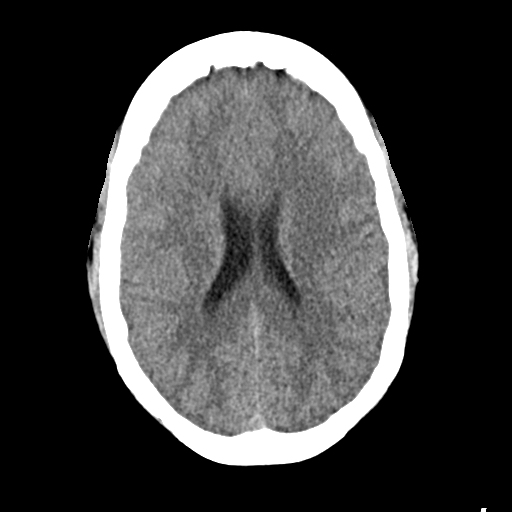
[im 16/30  bone]
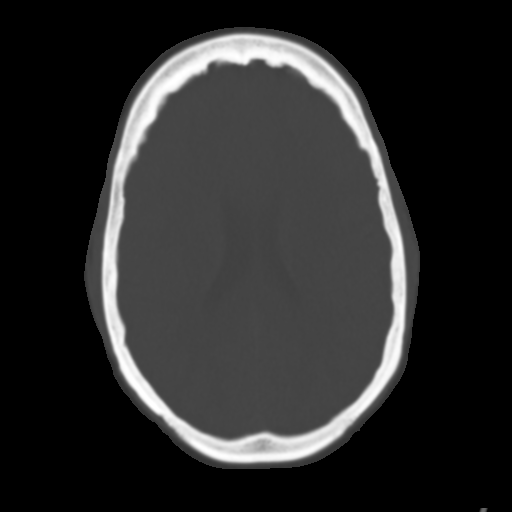
[im 19/30  brain]
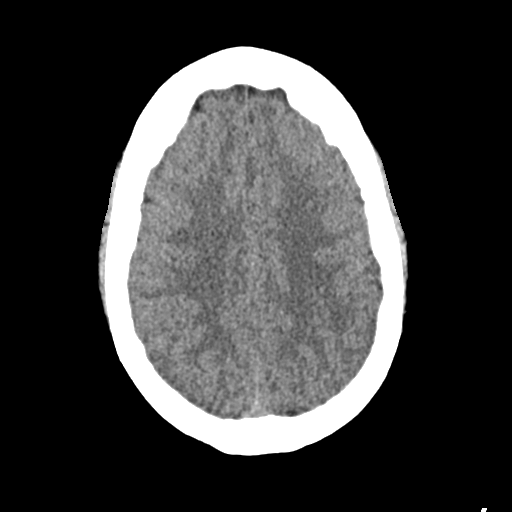
[im 22/30  brain]
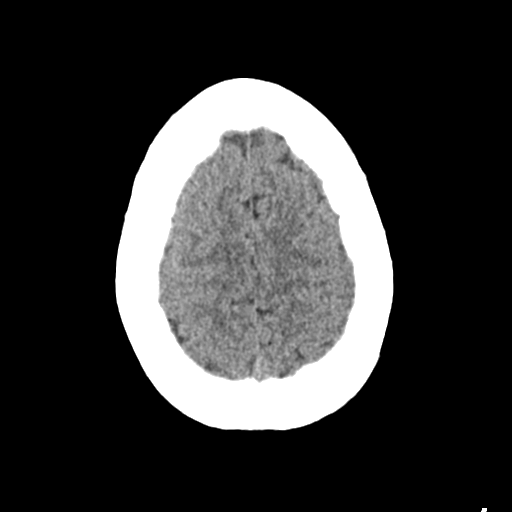
[im 25/30  brain]
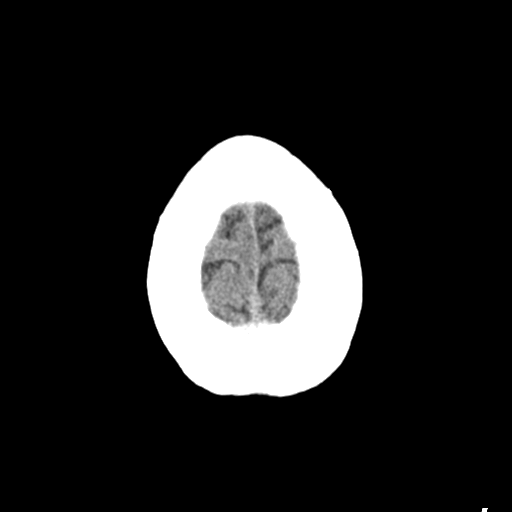
[im 28/30  brain]
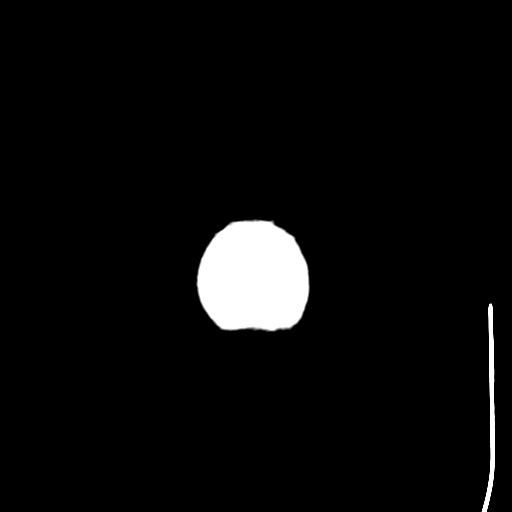
[im 28/30  bone]
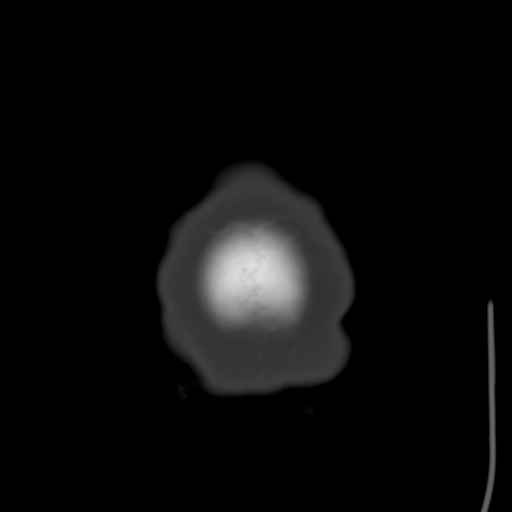

[Series 4: coronal soft · coronal · 0.31mm/px · 3 of 70 slices shown]
[im 24/70  brain]
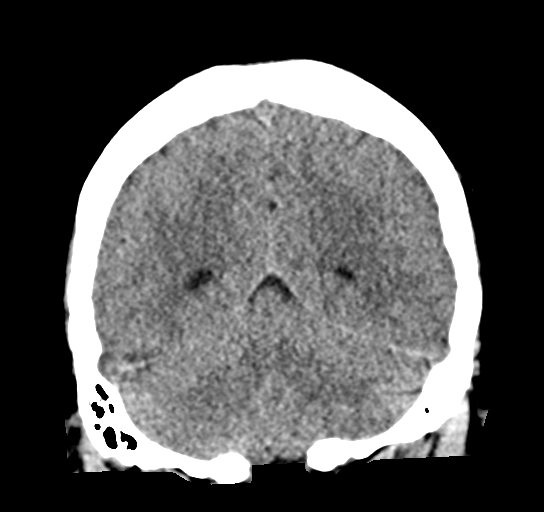
[im 31/70  brain]
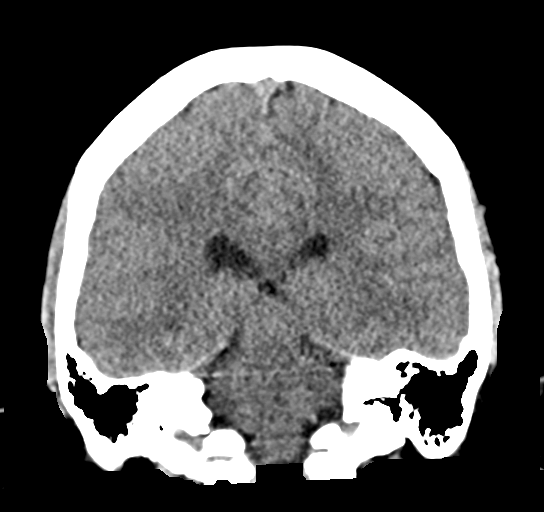
[im 39/70  brain]
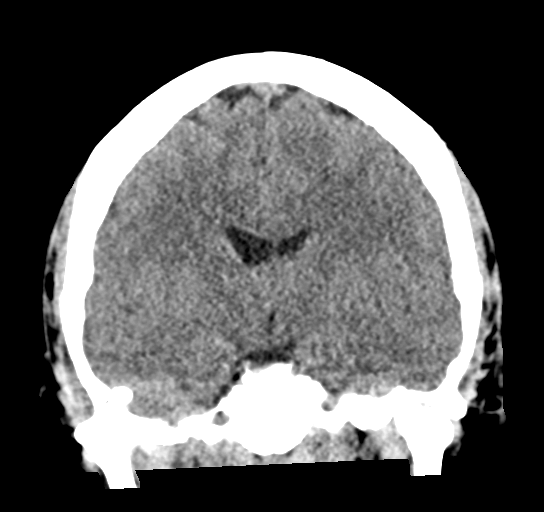

[Series 5: sag soft · sagittal · 0.29mm/px · 3 of 60 slices shown]
[im 20/60  brain]
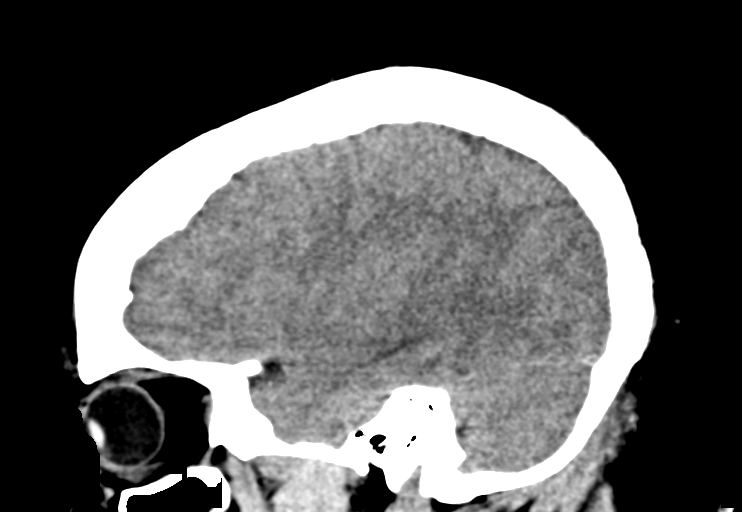
[im 30/60  brain]
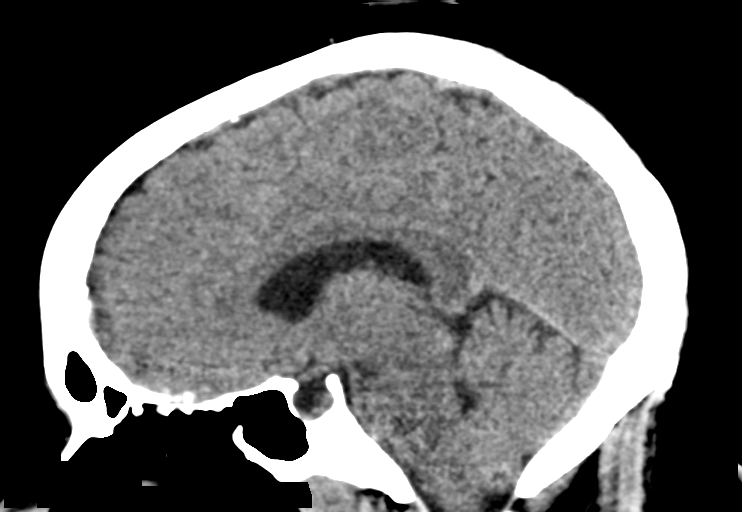
[im 40/60  brain]
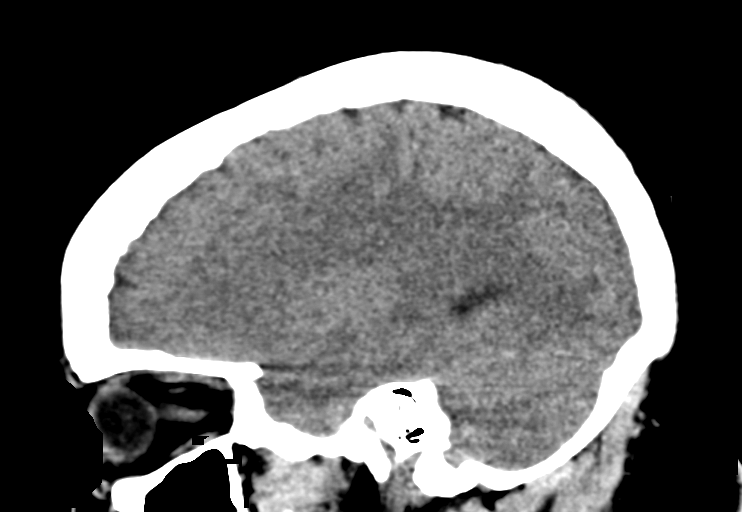

[15 of 47 positions shown; findings below may reference images not displayed]

FINDINGS: Brain: No acute intracranial abnormality. Specifically, no
hemorrhage, hydrocephalus, mass lesion, acute infarction, or
significant intracranial injury.

Vascular: No hyperdense vessel or unexpected calcification.

Skull: No acute calvarial abnormality.

Sinuses/Orbits: Visualized paranasal sinuses and mastoids clear.
Orbital soft tissues unremarkable.

Other: None
IMPRESSION: Normal study.

## 2020-10-08 ENCOUNTER — Encounter (HOSPITAL_BASED_OUTPATIENT_CLINIC_OR_DEPARTMENT_OTHER): Payer: Self-pay | Admitting: Emergency Medicine

## 2020-10-08 ENCOUNTER — Other Ambulatory Visit: Payer: Self-pay

## 2020-10-08 ENCOUNTER — Emergency Department (HOSPITAL_BASED_OUTPATIENT_CLINIC_OR_DEPARTMENT_OTHER)
Admission: EM | Admit: 2020-10-08 | Discharge: 2020-10-08 | Disposition: A | Discharge status: Home / Self Care | Payer: Managed Care, Other (non HMO) | Attending: Emergency Medicine | Admitting: Emergency Medicine

## 2020-10-08 DIAGNOSIS — H1032 Unspecified acute conjunctivitis, left eye: Secondary | ICD-10-CM | POA: Insufficient documentation

## 2020-10-08 DIAGNOSIS — H5712 Ocular pain, left eye: Secondary | ICD-10-CM | POA: Diagnosis present

## 2020-10-08 MED ORDER — FLUORESCEIN SODIUM 1 MG OP STRP
1.0000 | ORAL_STRIP | Freq: Once | OPHTHALMIC | Status: AC
  Administered 2020-10-08: 1 via OPHTHALMIC
  Filled 2020-10-08: qty 1

## 2020-10-08 MED ORDER — TETRACAINE HCL 0.5 % OP SOLN
1.0000 [drp] | Freq: Once | OPHTHALMIC | Status: AC
  Administered 2020-10-08: 1 [drp] via OPHTHALMIC
  Filled 2020-10-08: qty 4

## 2020-10-08 MED ORDER — POLYMYXIN B-TRIMETHOPRIM 10000-0.1 UNIT/ML-% OP SOLN: 1.0000 [drp] | OPHTHALMIC | 0 refills | Status: AC

## 2020-10-08 NOTE — ED Triage Notes (Signed)
Pt arrives pov with c/o left eye pain and irritation. Pt denies injury, reports drainage

## 2020-10-08 NOTE — ED Notes (Signed)
ED Provider at bedside. 

## 2020-10-08 NOTE — ED Provider Notes (Signed)
[facility] EMERGENCY DEPARTMENT Provider Note   CSN: [id number] Arrival date & time: 10/08/20  1311     History Chief Complaint  Patient presents with  . Eye Problem    Chelsea Harper is a 39 y.o. female who presents with concern for left eye pain since this morning.  She states that she woke early in the night around 3 AM with sensation foreign body in the left eye.  She fell back asleep and when she woke up around 7 she noted sensation of foreign body was persistent, there was some pain, redness, and now some thick sticky drainage from the left eye, with crusting in the nasal aspect of the left eye.  She denies congestion, sore throat, blurry vision, double vision, pain with EOMs.  She denies photophobia.  She denies new activity with exposure to fine debris, denies knowingly getting foreign body in her eye yesterday.  She states she has children at home, however none of them are sick or having similar symptoms.  Denies chest pain, shortness of breath, taste or smell, cough, abdominal pain, nausea, vomiting, diarrhea.  She has no symptoms in her right eye either.  I personally reviewed this patient's medical records.  She has history of sickle cell strain, depression, gestational hypertension.  She is vaccinated against COVID-19.   HPI     Past Medical History:  Diagnosis Date  . History of gestational diabetes 10/05/2014    [facility] Prenatal Labs  Dating LMP Blood type: O/POS/-- (12/01 1436)  Genetic Screen  Quad:           Normal 10/14/2014 Antibody:NEG (12/01 1436)  Anatomic [place]  1/8 nml with limited views of heart => [ ]  f/u Rubella: 0.91 (12/01 1436)  GTT Early:153.  3hr: 88, 149, 157, 117    Third trimester: 106, 180, 178, 161 RPR: NON REAC (02/04 1718)   TDaP vaccine April 2016 HBsAg: NEGATIVE (12/01 143  . History of gestational hypertension 02/28/2015    Patient Active Problem List   Diagnosis Date Noted  . History of gestational hypertension  02/28/2015  . AS (sickle cell trait) (HCC) 11/11/2014  . History of gestational diabetes 10/05/2014  . Depression, recurrent (HCC) 08/06/2014    Past Surgical History:  Procedure Laterality Date  . NO PAST SURGERIES       OB History    Gravida  2   Para  1   Term  1   Preterm      AB      Living  1     SAB      TAB      Ectopic      Multiple  0   Live Births  1           Family History  Problem Relation Age of Onset  . Diabetes Mother   . Heart disease Mother   . Cancer Mother        breast  . Hypertension Brother     Social History   Tobacco Use  . Smoking status: Never Smoker  . Smokeless tobacco: Never Used  Vaping Use  . Vaping Use: Never used  Substance Use Topics  . Alcohol use: Yes    Comment: occ  . Drug use: No    Home Medications Prior to Admission medications   Medication Sig Start Date End Date Taking? Authorizing Provider  buPROPion (WELLBUTRIN XL) 150 MG 24 hr tablet Take 150 mg by mouth daily.  [provider]  gabapentin (NEURONTIN) 300 MG capsule Take 300 mg by mouth daily.    [provider]  norgestimate-ethinyl estradiol (ORTHO-CYCLEN,SPRINTEC,PREVIFEM) 0.25-35 MG-MCG tablet Take 1 tablet by mouth daily. 05/16/16   Aviva Signs, CNM  ondansetron (ZOFRAN ODT) 4 MG disintegrating tablet Take 1-2 tablets (4-8 mg total) by mouth every 8 (eight) hours as needed for nausea or vomiting. 01/09/17   Ward, Layla Maw, DO  trimethoprim-polymyxin b (POLYTRIM) ophthalmic solution Place 1 drop into the left eye every 4 (four) hours for 5 days. 10/08/20 10/13/20  Tory Septer, Eugene Gavia, PA-C    Allergies    Patient has no known allergies.  Review of Systems   Review of Systems  Constitutional: Negative.   HENT: Negative.   Eyes: Positive for redness. Negative for photophobia, pain, itching and visual disturbance.       FB sensation in the left eye.  Respiratory: Negative.   Gastrointestinal: Negative.   Skin:  Negative.   Neurological: Negative.  Negative for light-headedness and headaches.  Hematological: Negative.     Physical Exam Updated Vital Signs BP 117/88 (BP Location: Left Arm)   Pulse 83   Temp 98.1 F (36.7 C) (Oral)   Resp 18   Ht 5\' 6"  (1.676 m)   Wt (!) 140.2 kg   LMP 09/16/2020   SpO2 100%   BMI 49.87 kg/m   Physical Exam Vitals and nursing note reviewed.  HENT:     Head: Normocephalic and atraumatic.     Nose: Nose normal.     Mouth/Throat:     Mouth: Mucous membranes are moist.     Pharynx: Oropharynx is clear. Uvula midline. No oropharyngeal exudate, posterior oropharyngeal erythema or uvula swelling.     Tonsils: No tonsillar exudate or tonsillar abscesses.  Eyes:     General:        Right eye: No discharge.        Left eye: Discharge present.    Extraocular Movements: Extraocular movements intact.     Conjunctiva/sclera:     Right eye: Right conjunctiva is not injected.     Left eye: Left conjunctiva is injected.     Pupils: Pupils are equal, round, and reactive to light.     Left eye: No corneal abrasion or fluorescein uptake. Seidel exam negative.    Comments: Clear to yellow discharge from the left eye, scant amount.  No evidence of corneal abrasion fluorescein stain and woods lamp exam.  Cardiovascular:     Rate and Rhythm: Normal rate and regular rhythm.     Pulses: Normal pulses.     Heart sounds: Normal heart sounds. No murmur heard.   Pulmonary:     Effort: Pulmonary effort is normal. No respiratory distress.     Breath sounds: Normal breath sounds. No wheezing or rales.  Abdominal:     General: There is no distension.  Musculoskeletal:        General: No deformity.     Cervical back: Neck supple. No rigidity or tenderness.  Lymphadenopathy:     Cervical: No cervical adenopathy.  Skin:    General: Skin is warm and dry.     Capillary Refill: Capillary refill takes less than 2 seconds.  Neurological:     General: No focal deficit present.      Mental Status: She is alert and oriented to person, place, and time.  Psychiatric:        Mood and Affect: Mood normal.  ED Results / Procedures / Treatments   Labs (all labs ordered are listed, but only abnormal results are displayed) Labs Reviewed - No data to display  EKG None  Radiology No results found.  Procedures Procedures (including critical care time)  Medications Ordered in ED Medications  fluorescein ophthalmic strip 1 strip (1 strip Left Eye Given by Other 10/08/20 1546)  tetracaine (PONTOCAINE) 0.5 % ophthalmic solution 1 drop (1 drop Left Eye Given by Other 10/08/20 1545)    ED Course  I have reviewed the triage vital signs and the nursing notes.  Pertinent labs & imaging results that were available during my care of the patient were reviewed by me and considered in my medical decision making (see chart for details).    MDM Rules/Calculators/A&P                         39 year old female presents with concern for 1 day of left eye redness, foreign body sensation, drainage.  Differential diagnosis patient symptoms includes but is not limited to conjunctivitis (viral/bacterial), foreign body, corneal abrasion blepharitis, allergic reaction.   Patient hypertensive to 154/95 on intake.  Presents otherwise normal.  Physical exam is very reassuring, there is no evidence of corneal abrasion with fluorescein uptake or Seidel sign.  Left eye conjunctive are injected, areas clear to yellow drainage from the left eye.  Physical exam is most consistent with conjunctivitis.  Will discharge home with a prescription for antibiotic eyedrops.  The patient voiced understanding of her medical evaluation and treatment plan.  Her questions were answered to expressed satisfaction.  Return precautions given.  Patient to follow-up with primary care doctor.  Patient is well-appearing, stable, and appropriate for discharge at this time.  Final Clinical Impression(s) / ED  Diagnoses Final diagnoses:  Acute conjunctivitis of left eye, unspecified acute conjunctivitis type    Rx / DC Orders ED Discharge Orders         Ordered    trimethoprim-polymyxin b (POLYTRIM) ophthalmic solution  Every 4 hours        10/08/20 1637           Tae Vonada, Idelia Salm 10/08/20 2007    Haviland, Julie, MD 10/09/20 1520

## 2020-10-08 NOTE — Discharge Instructions (Addendum)
Your evaluated emergency department today for your left eye redness and irritation.  Your physical exam was very reassuring.  We examined your eye using a special dye and light, and there is no evidence of injury to your cornea.  This is very good news.  It is likely you have a conjunctivitis.  The most common cause by a virus, however can be bacterial in nature as well.  You have been prescribed antibiotic eyedrops which you should instill in your eye as directed. You may follow-up with your primary care doctor as needed.  Return to the emergency department follow-up worsening eye pain, blurry vision, double vision, or other new severe symptoms.

## 2020-11-22 ENCOUNTER — Emergency Department (HOSPITAL_BASED_OUTPATIENT_CLINIC_OR_DEPARTMENT_OTHER)
Admission: EM | Admit: 2020-11-22 | Discharge: 2020-11-22 | Disposition: A | Discharge status: Home / Self Care | Payer: Managed Care, Other (non HMO) | Attending: Emergency Medicine | Admitting: Emergency Medicine

## 2020-11-22 ENCOUNTER — Encounter (HOSPITAL_BASED_OUTPATIENT_CLINIC_OR_DEPARTMENT_OTHER): Payer: Self-pay

## 2020-11-22 ENCOUNTER — Other Ambulatory Visit: Payer: Self-pay

## 2020-11-22 DIAGNOSIS — J069 Acute upper respiratory infection, unspecified: Secondary | ICD-10-CM | POA: Diagnosis not present

## 2020-11-22 DIAGNOSIS — Z20822 Contact with and (suspected) exposure to covid-19: Secondary | ICD-10-CM | POA: Insufficient documentation

## 2020-11-22 DIAGNOSIS — R0981 Nasal congestion: Secondary | ICD-10-CM | POA: Diagnosis present

## 2020-11-22 LAB — GROUP A STREP BY PCR: Group A Strep by PCR: NOT DETECTED

## 2020-11-22 LAB — SARS CORONAVIRUS 2 (TAT 6-24 HRS): SARS Coronavirus 2: NEGATIVE

## 2020-11-22 MED ORDER — FLUTICASONE PROPIONATE 50 MCG/ACT NA SUSP: 2.0000 | Freq: Every day | NASAL | 0 refills | Status: AC

## 2020-11-22 NOTE — ED Provider Notes (Signed)
[facility] EMERGENCY DEPARTMENT Provider Note   CSN: [id number] Arrival date & time: 11/22/20  1036     History Chief Complaint  Patient presents with  . Sore Throat    Chelsea Harper is a 40 y.o. female with no pertinent past medical history presents to the emergency department today for URI symptoms.  Patient states that for the past 2 days she has been not sleeping well due to congestion in her throat including nasal congestion and sore throat.  Patient states that she has been having lots of drainage.  Sore throat hurts worse on right side.  Denies any drooling, trismus, difficulty breathing.  Denies any chest pain.  Denies any difficulty swallowing.  Has been eating and drinking normally.  States that she is been taking cough medication, when asked if patient has a cough she says that her cough comes and goes, will be present once a day.  Seems as if this is more throat clearing.  Is nonproductive.  Denies any hemoptysis.  Denies any myalgias, fevers or chills.  Has been vaccinated and boosted against COVID.  Denies any sick contacts.  Denies any facial pain.  States that her ears do feel full.  No hearing changes, otorrhea or tinnitus.  Denies any headache.  Denies any vision changes or neck pain.  No other complaints.  Denies any nausea, vomiting, diarrhea.Marland Kitchen  HPI     Past Medical History:  Diagnosis Date  . History of gestational diabetes 10/05/2014    [facility] Prenatal Labs  Dating LMP Blood type: O/POS/-- (12/01 1436)  Genetic Screen  Quad:           Normal 10/14/2014 Antibody:NEG (12/01 1436)  Anatomic [place]  1/8 nml with limited views of heart => [ ]  f/u Rubella: 0.91 (12/01 1436)  GTT Early:153.  3hr: 88, 149, 157, 117    Third trimester: 106, 180, 178, 161 RPR: NON REAC (02/04 1718)   TDaP vaccine April 2016 HBsAg: NEGATIVE (12/01 143  . History of gestational hypertension 02/28/2015    Patient Active Problem List   Diagnosis Date Noted  . History of  gestational hypertension 02/28/2015  . AS (sickle cell trait) (HCC) 11/11/2014  . History of gestational diabetes 10/05/2014  . Depression, recurrent (HCC) 08/06/2014    Past Surgical History:  Procedure Laterality Date  . NO PAST SURGERIES       OB History    Gravida  2   Para  1   Term  1   Preterm      AB      Living  1     SAB      IAB      Ectopic      Multiple  0   Live Births  1           Family History  Problem Relation Age of Onset  . Diabetes Mother   . Heart disease Mother   . Cancer Mother        breast  . Hypertension Brother     Social History   Tobacco Use  . Smoking status: Never Smoker  . Smokeless tobacco: Never Used  Vaping Use  . Vaping Use: Never used  Substance Use Topics  . Alcohol use: Yes    Comment: occ  . Drug use: No    Home Medications Prior to Admission medications   Medication Sig Start Date End Date Taking? Authorizing Provider  fluticasone (FLONASE) 50 MCG/ACT nasal spray Place  2 sprays into both nostrils daily for 6 days. 11/22/20 11/28/20 Yes Kilani Joffe, PA-C  buPROPion (WELLBUTRIN XL) 150 MG 24 hr tablet Take 150 mg by mouth daily.    [provider]  gabapentin (NEURONTIN) 300 MG capsule Take 300 mg by mouth daily.    [provider]  norgestimate-ethinyl estradiol (ORTHO-CYCLEN,SPRINTEC,PREVIFEM) 0.25-35 MG-MCG tablet Take 1 tablet by mouth daily. 05/16/16   Aviva Signs, CNM  ondansetron (ZOFRAN ODT) 4 MG disintegrating tablet Take 1-2 tablets (4-8 mg total) by mouth every 8 (eight) hours as needed for nausea or vomiting. 01/09/17   Ward, Layla Maw, DO    Allergies    Patient has no known allergies.  Review of Systems   Review of Systems  Constitutional: Negative for chills, diaphoresis, fatigue and fever.  HENT: Positive for congestion and sore throat. Negative for drooling, ear discharge, ear pain, facial swelling, rhinorrhea, sinus pressure, sinus pain, sneezing, tinnitus,  trouble swallowing and voice change.   Eyes: Negative for pain and visual disturbance.  Respiratory: Negative for cough, shortness of breath and wheezing.   Cardiovascular: Negative for chest pain, palpitations and leg swelling.  Gastrointestinal: Negative for abdominal distention, abdominal pain, diarrhea, nausea and vomiting.  Genitourinary: Negative for difficulty urinating.  Musculoskeletal: Negative for back pain, neck pain and neck stiffness.  Skin: Negative for pallor.  Neurological: Negative for dizziness, speech difficulty, weakness and headaches.  Psychiatric/Behavioral: Positive for sleep disturbance. Negative for confusion.    Physical Exam Updated Vital Signs BP 125/73 (BP Location: Right Arm)   Pulse 93   Temp 98.2 F (36.8 C) (Oral)   Resp 20   Ht 5' 6.5" (1.689 m)   Wt (!) 138.8 kg   LMP 11/10/2020   SpO2 98%   BMI 48.65 kg/m   Physical Exam Constitutional:      General: She is not in acute distress.    Appearance: Normal appearance. She is not ill-appearing, toxic-appearing or diaphoretic.     Comments: Patient without acute respiratory stress.  Patient is sitting comfortably in bed, no tripoding, use of accessory muscles.  Patient is speaking to me in full sentences.  Handling secretions well.  HENT:     Head: Normocephalic and atraumatic.     Jaw: There is normal jaw occlusion. No trismus, swelling or malocclusion.     Comments: No trismus, no facial tenderness.    Nose: Congestion present. No rhinorrhea.     Right Sinus: No maxillary sinus tenderness or frontal sinus tenderness.     Left Sinus: No maxillary sinus tenderness or frontal sinus tenderness.     Mouth/Throat:     Mouth: Mucous membranes are moist. No oral lesions.     Dentition: Normal dentition.     Tongue: No lesions.     Palate: No mass and lesions.     Pharynx: Oropharynx is clear. Uvula midline. No pharyngeal swelling, oropharyngeal exudate, posterior oropharyngeal erythema or uvula  swelling.     Tonsils: No tonsillar exudate or tonsillar abscesses. 1+ on the right. 1+ on the left.     Comments:   Patient with some erythema bilaterally on tonsils.,  I think that there is some mild exudate on right tonsil.  Did try to use a tongue depressor to see this 4-5 times, however patient was unable to tolerate exam.  No signs of peritonsillar abscess, palate without any tenderness or masses palpated.  No swelling under the tongue, uvula is midline without any inflammation. Eyes:  General: No visual field deficit.       Right eye: No discharge.        Left eye: No discharge.     Extraocular Movements: Extraocular movements intact.     Conjunctiva/sclera: Conjunctivae normal.     Pupils: Pupils are equal, round, and reactive to light.  Cardiovascular:     Rate and Rhythm: Normal rate and regular rhythm.     Pulses: Normal pulses.     Heart sounds: Normal heart sounds. No murmur heard. No friction rub. No gallop.   Pulmonary:     Effort: Pulmonary effort is normal. No respiratory distress.     Breath sounds: Normal breath sounds. No stridor. No wheezing, rhonchi or rales.  Chest:     Chest wall: No tenderness.  Abdominal:     General: Abdomen is flat. Bowel sounds are normal. There is no distension.     Palpations: Abdomen is soft.     Tenderness: There is no abdominal tenderness. There is no right CVA tenderness or left CVA tenderness.  Musculoskeletal:        General: No swelling or tenderness. Normal range of motion.     Cervical back: Normal range of motion. No rigidity or tenderness.     Right lower leg: No edema.     Left lower leg: No edema.  Lymphadenopathy:     Cervical: No cervical adenopathy.  Skin:    General: Skin is warm and dry.     Capillary Refill: Capillary refill takes less than 2 seconds.     Findings: No erythema or rash.  Neurological:     General: No focal deficit present.     Mental Status: She is alert and oriented to person, place, and time.      Cranial Nerves: Cranial nerves are intact. No cranial nerve deficit or facial asymmetry.     Motor: Motor function is intact. No weakness.     Coordination: Coordination is intact.     Gait: Gait is intact. Gait normal.  Psychiatric:        Mood and Affect: Mood normal.     ED Results / Procedures / Treatments   Labs (all labs ordered are listed, but only abnormal results are displayed) Labs Reviewed  GROUP A STREP BY PCR  SARS CORONAVIRUS 2 (TAT 6-24 HRS)    EKG None  Radiology No results found.  Procedures Procedures (including critical care time)  Medications Ordered in ED Medications - No data to display  ED Course  I have reviewed the triage vital signs and the nursing notes.  Pertinent labs & imaging results that were available during my care of the patient were reviewed by me and considered in my medical decision making (see chart for details).    MDM Rules/Calculators/A&P                         Mollyann Halbert is a 40 y.o. female with no pertinent past medical history presents to the emergency department today for URI symptoms.  Patient appears well, however does appear to have some congestion with some erythema and questionable pus on right tonsil.  Will obtain strep swab and COVID swab at this time.  Patient afebrile, nontachycardic, nonhypoxic.  No red flag symptoms in regards to throat.  Strep test negative.  Normal vital signs, patient to be discharged at this time.  COVID pending.  Symptomatic treatment discussed, patient will follow-up with PCP.  Doubt need for  further emergent work up at this time. I explained the diagnosis and have given explicit precautions to return to the ER including for any other new or worsening symptoms. The patient understands and accepts the medical plan as it's been dictated and I have answered their questions. Discharge instructions concerning home care and prescriptions have been given. The patient is STABLE and is  discharged to home in good condition.  Sherrilyn Ristakesha Hietpas was evaluated in Emergency Department on 11/22/2020 for the symptoms described in the history of present illness. She was evaluated in the context of the global COVID-19 pandemic, which necessitated consideration that the patient might be at risk for infection with the SARS-CoV-2 virus that causes COVID-19. Institutional protocols and algorithms that pertain to the evaluation of patients at risk for COVID-19 are in a state of rapid change based on information released by regulatory bodies including the CDC and federal and state organizations. These policies and algorithms were followed during the patient's care in the ED.  Final Clinical Impression(s) / ED Diagnoses Final diagnoses:  Viral upper respiratory tract infection    Rx / DC Orders ED Discharge Orders         Ordered    fluticasone (FLONASE) 50 MCG/ACT nasal spray  Daily        11/22/20 1441           Farrel Gordonatel, Cliffard Hair, PA-C 11/22/20 2021    Melene PlanFloyd, Dan, DO 11/23/20 0700

## 2020-11-22 NOTE — ED Triage Notes (Signed)
Pt reports nasal congestion, sore throat, not sleeping well x 2 days. Pt denies any fever at home.

## 2020-11-22 NOTE — Discharge Instructions (Signed)
You were seen today for an upper respiratory infection. Use Flonase as directed for your congestion and Mucinex/Robitussin for your cough. Continue to stay well-hydrated. Gargle warm salt water and spit it out for sore throat. Take benadryl or other antihistamine to decrease secretions and for watery itchy eyes. Your COVID test result in the next 24 hours, if this is positive please follow CDC guidelines and isolate for 5 days. Continued to alternate between Tylenol and ibuprofen for pain. Followup with your primary care doctor in 5-7 days or referral for urgent care for recheck of ongoing symptoms however return to emergency department for emergent changing or worsening of symptoms- example: shortness of breath, chest pain, difficulty swallowing, muffled voice.

## 2021-07-07 ENCOUNTER — Other Ambulatory Visit: Payer: Self-pay

## 2021-07-07 ENCOUNTER — Encounter (HOSPITAL_BASED_OUTPATIENT_CLINIC_OR_DEPARTMENT_OTHER): Payer: Self-pay

## 2021-07-07 ENCOUNTER — Emergency Department (HOSPITAL_BASED_OUTPATIENT_CLINIC_OR_DEPARTMENT_OTHER): Payer: Managed Care, Other (non HMO)

## 2021-07-07 ENCOUNTER — Emergency Department (HOSPITAL_BASED_OUTPATIENT_CLINIC_OR_DEPARTMENT_OTHER)
Admission: EM | Admit: 2021-07-07 | Discharge: 2021-07-07 | Disposition: A | Discharge status: Home / Self Care | Payer: Managed Care, Other (non HMO) | Attending: Emergency Medicine | Admitting: Emergency Medicine

## 2021-07-07 DIAGNOSIS — R110 Nausea: Secondary | ICD-10-CM | POA: Insufficient documentation

## 2021-07-07 DIAGNOSIS — R1032 Left lower quadrant pain: Secondary | ICD-10-CM | POA: Diagnosis not present

## 2021-07-07 DIAGNOSIS — R1031 Right lower quadrant pain: Secondary | ICD-10-CM | POA: Insufficient documentation

## 2021-07-07 DIAGNOSIS — R1030 Lower abdominal pain, unspecified: Secondary | ICD-10-CM

## 2021-07-07 LAB — CBC WITH DIFFERENTIAL/PLATELET
Abs Immature Granulocytes: 0.06 10*3/uL (ref 0.00–0.07)
Basophils Absolute: 0.1 10*3/uL (ref 0.0–0.1)
Basophils Relative: 0 %
Eosinophils Absolute: 0.4 10*3/uL (ref 0.0–0.5)
Eosinophils Relative: 3 %
HCT: 35.1 % — ABNORMAL LOW (ref 36.0–46.0)
Hemoglobin: 11.6 g/dL — ABNORMAL LOW (ref 12.0–15.0)
Immature Granulocytes: 0 %
Lymphocytes Relative: 24 %
Lymphs Abs: 3.4 10*3/uL (ref 0.7–4.0)
MCH: 27.6 pg (ref 26.0–34.0)
MCHC: 33 g/dL (ref 30.0–36.0)
MCV: 83.6 fL (ref 80.0–100.0)
Monocytes Absolute: 0.9 10*3/uL (ref 0.1–1.0)
Monocytes Relative: 7 %
Neutro Abs: 9.5 10*3/uL — ABNORMAL HIGH (ref 1.7–7.7)
Neutrophils Relative %: 66 %
Platelets: 442 10*3/uL — ABNORMAL HIGH (ref 150–400)
RBC: 4.2 MIL/uL (ref 3.87–5.11)
RDW: 14 % (ref 11.5–15.5)
WBC: 14.3 10*3/uL — ABNORMAL HIGH (ref 4.0–10.5)
nRBC: 0 % (ref 0.0–0.2)

## 2021-07-07 LAB — COMPREHENSIVE METABOLIC PANEL
ALT: 30 U/L (ref 0–44)
AST: 23 U/L (ref 15–41)
Albumin: 3.7 g/dL (ref 3.5–5.0)
Alkaline Phosphatase: 95 U/L (ref 38–126)
Anion gap: 7 (ref 5–15)
BUN: 12 mg/dL (ref 6–20)
CO2: 27 mmol/L (ref 22–32)
Calcium: 9 mg/dL (ref 8.9–10.3)
Chloride: 105 mmol/L (ref 98–111)
Creatinine, Ser: 1.29 mg/dL — ABNORMAL HIGH (ref 0.44–1.00)
GFR, Estimated: 54 mL/min — ABNORMAL LOW (ref >60)
Glucose, Bld: 87 mg/dL (ref 70–99)
Potassium: 3.5 mmol/L (ref 3.5–5.1)
Sodium: 139 mmol/L (ref 135–145)
Total Bilirubin: 0.6 mg/dL (ref 0.3–1.2)
Total Protein: 7.2 g/dL (ref 6.5–8.1)

## 2021-07-07 LAB — LIPASE, BLOOD: Lipase: 25 U/L (ref 11–51)

## 2021-07-07 LAB — URINALYSIS, ROUTINE W REFLEX MICROSCOPIC
Bilirubin Urine: NEGATIVE
Glucose, UA: NEGATIVE mg/dL
Hgb urine dipstick: NEGATIVE
Ketones, ur: NEGATIVE mg/dL
Leukocytes,Ua: NEGATIVE
Nitrite: NEGATIVE
Protein, ur: NEGATIVE mg/dL
Specific Gravity, Urine: 1.005 (ref 1.005–1.030)
pH: 7.5 (ref 5.0–8.0)

## 2021-07-07 LAB — WET PREP, GENITAL
Sperm: NONE SEEN
Trich, Wet Prep: NONE SEEN
Yeast Wet Prep HPF POC: NONE SEEN

## 2021-07-07 LAB — PREGNANCY, URINE: Preg Test, Ur: NEGATIVE

## 2021-07-07 MED ORDER — IOHEXOL 350 MG/ML SOLN
100.0000 mL | Freq: Once | INTRAVENOUS | Status: AC | PRN
  Administered 2021-07-07: 100 mL via INTRAVENOUS

## 2021-07-07 MED ORDER — SODIUM CHLORIDE 0.9 % IV SOLN: Freq: Once | INTRAVENOUS | Status: AC

## 2021-07-07 MED ORDER — ONDANSETRON 4 MG PO TBDP: 4.0000 mg | ORAL_TABLET | ORAL | 0 refills | Status: AC | PRN

## 2021-07-07 MED ORDER — IBUPROFEN 600 MG PO TABS: 600.0000 mg | ORAL_TABLET | Freq: Four times a day (QID) | ORAL | 0 refills | Status: AC | PRN

## 2021-07-07 MED ORDER — KETOROLAC TROMETHAMINE 30 MG/ML IJ SOLN
30.0000 mg | Freq: Once | INTRAMUSCULAR | Status: AC
  Administered 2021-07-07: 30 mg via INTRAVENOUS
  Filled 2021-07-07: qty 1

## 2021-07-07 NOTE — Discharge Instructions (Addendum)
1.  At this time your CT scan does not show any indication of appendicitis or surgical cause for your pain.  Your pelvic exam did not show a large amount of discharge or significant tenderness on exam.  Gonorrhea and Chlamydia tests have been done but will not have a result for 1 to 3 days.  If you need treatment you will be called. Take ibuprofen every 6-8 hours for pain as needed.  Take Zofran if needed for nausea. Schedule follow-up appointment with your gynecologist within the next 3 to 5 days.  Return to emergency department if you have worsening or new symptoms.

## 2021-07-07 NOTE — ED Provider Notes (Signed)
[facility] EMERGENCY DEPARTMENT Provider Note   CSN: [id number] Arrival date & time: 07/07/21  1828     History Chief Complaint  Patient presents with   Abdominal Pain    Chelsea Harper is a 40 y.o. female.  HPI Patient reports that she is having more abdominal pain.  Is been there approximately 3 days.  She reports that it is both on the left and the right and in the suprapubic area.  She reports it is gotten very uncomfortable and now she is driving and goes over bumps it hurts in her lower abdomen.  She denies any abnormal vaginal discharge or bleeding.  She has had a little bit of discomfort with urination and thought she might have a UTI.  She has been nauseated but no vomiting.  Patient reports she had a bowel movement yesterday but still feels like she may be constipated.  Patient reports she is sexually active with a single partner.  No history of appendicitis.  No history of similar abdominal pain.    Past Medical History:  Diagnosis Date   History of gestational diabetes 10/05/2014    Clinic [facility] Prenatal Labs  Dating LMP Blood type: O/POS/-- (12/01 1436)  Genetic Screen  Quad:           Normal 10/14/2014 Antibody:NEG (12/01 1436)  Anatomic [place]  1/8 nml with limited views of heart => [ ]  f/u Rubella: 0.91 (12/01 1436)  GTT Early:153.  3hr: 88, 149, 157, 117    Third trimester: 106, 180, 178, 161 RPR: NON REAC (02/04 1718)   TDaP vaccine April 2016 HBsAg: NEGATIVE (12/01 143   History of gestational hypertension 02/28/2015    Patient Active Problem List   Diagnosis Date Noted   History of gestational hypertension 02/28/2015   AS (sickle cell trait) (HCC) 11/11/2014   History of gestational diabetes 10/05/2014   Depression, recurrent (HCC) 08/06/2014    Past Surgical History:  Procedure Laterality Date   NO PAST SURGERIES       OB History     Gravida  2   Para  1   Term  1   Preterm      AB      Living  1      SAB      IAB      Ectopic       Multiple  0   Live Births  1           Family History  Problem Relation Age of Onset   Diabetes Mother    Heart disease Mother    Cancer Mother        breast   Hypertension Brother     Social History   Tobacco Use   Smoking status: Never   Smokeless tobacco: Never  Vaping Use   Vaping Use: Never used  Substance Use Topics   Alcohol use: Yes    Comment: occ   Drug use: No    Home Medications Prior to Admission medications   Medication Sig Start Date End Date Taking? Authorizing Provider  ibuprofen (ADVIL) 600 MG tablet Take 1 tablet (600 mg total) by mouth every 6 (six) hours as needed. 07/07/21  Yes 09/06/21, MD  ondansetron (ZOFRAN ODT) 4 MG disintegrating tablet Take 1 tablet (4 mg total) by mouth every 4 (four) hours as needed for nausea or vomiting. 07/07/21  Yes 09/06/21, MD  buPROPion (WELLBUTRIN XL) 150 MG 24 hr tablet Take 150 mg  by mouth daily.    [provider]  fluticasone (FLONASE) 50 MCG/ACT nasal spray Place 2 sprays into both nostrils daily for 6 days. 11/22/20 11/28/20  Farrel Gordon, PA-C  gabapentin (NEURONTIN) 300 MG capsule Take 300 mg by mouth daily.    [provider]  norgestimate-ethinyl estradiol (ORTHO-CYCLEN,SPRINTEC,PREVIFEM) 0.25-35 MG-MCG tablet Take 1 tablet by mouth daily. 05/16/16   Aviva Signs, CNM  ondansetron (ZOFRAN ODT) 4 MG disintegrating tablet Take 1-2 tablets (4-8 mg total) by mouth every 8 (eight) hours as needed for nausea or vomiting. 01/09/17   Ward, Layla Maw, DO    Allergies    Patient has no known allergies.  Review of Systems   Review of Systems 10 systems reviewed and negative except as per HPI Physical Exam Updated Vital Signs BP 132/77   Pulse 78   Temp 98.6 F (37 C) (Oral)   Resp 18   Ht 5\' 6"  (1.676 m)   Wt (!) 141.5 kg   SpO2 99%   BMI 50.36 kg/m   Physical Exam Constitutional:      Appearance: Normal appearance.  HENT:     Mouth/Throat:     Pharynx:  Oropharynx is clear.  Eyes:     Extraocular Movements: Extraocular movements intact.  Cardiovascular:     Rate and Rhythm: Normal rate and regular rhythm.  Pulmonary:     Effort: Pulmonary effort is normal.     Breath sounds: Normal breath sounds.  Abdominal:     Comments: Lower abdomen tender.  Tender in the right lower quadrant and suprapubic area.  Mild tenderness left lower quadrant.  No guarding.  No masses or fullness in the inguinal regions.  Upper abdomen nontender.  Genitourinary:    Vagina: Normal. No vaginal discharge.     Comments: Mild diffuse tenderness to palpation of the uterus and adnexa.  No significant discharge from the cervix. Musculoskeletal:        General: No swelling or tenderness. Normal range of motion.  Skin:    General: Skin is warm and dry.  Neurological:     General: No focal deficit present.     Mental Status: She is alert and oriented to person, place, and time.     Coordination: Coordination normal.  Psychiatric:        Mood and Affect: Mood normal.    ED Results / Procedures / Treatments   Labs (all labs ordered are listed, but only abnormal results are displayed) Labs Reviewed  WET PREP, GENITAL - Abnormal; Notable for the following components:      Result Value   Clue Cells Wet Prep HPF POC PRESENT (*)    WBC, Wet Prep HPF POC FEW (*)    All other components within normal limits  URINALYSIS, ROUTINE W REFLEX MICROSCOPIC - Abnormal; Notable for the following components:   Color, Urine STRAW (*)    APPearance CLOUDY (*)    All other components within normal limits  COMPREHENSIVE METABOLIC PANEL - Abnormal; Notable for the following components:   Creatinine, Ser 1.29 (*)    GFR, Estimated 54 (*)    All other components within normal limits  CBC WITH DIFFERENTIAL/PLATELET - Abnormal; Notable for the following components:   WBC 14.3 (*)    Hemoglobin 11.6 (*)    HCT 35.1 (*)    Platelets 442 (*)    Neutro Abs 9.5 (*)    All other  components within normal limits  PREGNANCY, URINE  LIPASE, BLOOD  GC/CHLAMYDIA  PROBE AMP ([place]) NOT AT [facility]    EKG None  Radiology CT Abdomen Pelvis W Contrast  Result Date: 07/07/2021 CLINICAL DATA:  Nauseated EXAM: CT ABDOMEN AND PELVIS WITH CONTRAST TECHNIQUE: Multidetector CT imaging of the abdomen and pelvis was performed using the standard protocol following bolus administration of intravenous contrast. CONTRAST:  OMNIPAQUE IOHEXOL 350 MG/ML SOLN COMPARISON:  None. FINDINGS: Lower chest: No acute abnormality. Hepatobiliary: No focal liver abnormality is seen. No gallstones, gallbladder wall thickening, or biliary dilatation. Pancreas: Unremarkable. No pancreatic ductal dilatation or surrounding inflammatory changes. Spleen: Normal in size without focal abnormality. Adrenals/Urinary Tract: Adrenal glands are unremarkable. Kidneys are normal, without renal calculi, focal lesion, or hydronephrosis. Bladder is unremarkable. Stomach/Bowel: Stomach is within normal limits. Appendix appears normal. No evidence of bowel wall thickening, distention, or inflammatory changes. Vascular/Lymphatic: No significant vascular findings are present. No enlarged abdominal or pelvic lymph nodes. Reproductive: Uterus and bilateral adnexa are unremarkable. Other: No abdominal wall hernia or abnormality. No abdominopelvic ascites. Musculoskeletal: No acute or significant osseous findings. Degenerative 2 changes of the lumbar spine most notable at L4-L5 and L5-S1. IMPRESSION: Negative. No CT evidence for acute intra-abdominopelvic abnormality. Electronically Signed   By: Jasmine Pang M.D.   On: 07/07/2021 21:18    Procedures Procedures   Medications Ordered in ED Medications  ketorolac (TORADOL) 30 MG/ML injection 30 mg (30 mg Intravenous Given 07/07/21 2013)  0.9 %  sodium chloride infusion ( Intravenous New Bag/Given 07/07/21 2014)  iohexol (OMNIPAQUE) 350 MG/ML injection 100 mL (100 mLs Intravenous  Contrast Given 07/07/21 2113)    ED Course  I have reviewed the triage vital signs and the nursing notes.  Pertinent labs & imaging results that were available during my care of the patient were reviewed by me and considered in my medical decision making (see chart for details).    MDM Rules/Calculators/A&P                           Patient presents with lower abdominal pain as outlined.  At this time CT scan does not show any acute findings to suggest surgical etiology.  Pelvic exam is for mild discomfort but no significant discharge.  Lower suspicion for STI at this point.  Will not proceed with empiric treatment.  Patient aware that test will be pending for 2 to 3 days.  At this time, plan will be for ibuprofen as needed for pain and follow-up with GYN.  Return precautions reviewed. Final Clinical Impression(s) / ED Diagnoses Final diagnoses:  Lower abdominal pain    Rx / DC Orders ED Discharge Orders          Ordered    ibuprofen (ADVIL) 600 MG tablet  Every 6 hours PRN        07/07/21 2241    ondansetron (ZOFRAN ODT) 4 MG disintegrating tablet  Every 4 hours PRN        07/07/21 2241             Arby Barrette, MD 07/07/21 2243

## 2021-07-07 NOTE — ED Triage Notes (Signed)
Pt arrives ambulatory to ED reports pain in lower abdomen for "72 hours" states that she has been feeling nauseated, feeling like she needs to have BM, last BM yesterday, also c/o cloudy urine. Pt states "I feel ike I have a UTI".

## 2021-07-12 LAB — GC/CHLAMYDIA PROBE AMP (~~LOC~~) NOT AT ARMC
Chlamydia: NEGATIVE
Comment: NEGATIVE
Comment: NORMAL
Neisseria Gonorrhea: NEGATIVE

## 2023-05-04 ENCOUNTER — Other Ambulatory Visit: Payer: Self-pay

## 2023-06-16 ENCOUNTER — Emergency Department (HOSPITAL_BASED_OUTPATIENT_CLINIC_OR_DEPARTMENT_OTHER)
Admission: EM | Admit: 2023-06-16 | Discharge: 2023-06-16 | Disposition: A | Discharge status: Home / Self Care | Payer: Managed Care, Other (non HMO) | Attending: Emergency Medicine | Admitting: Emergency Medicine

## 2023-06-16 ENCOUNTER — Other Ambulatory Visit: Payer: Self-pay

## 2023-06-16 ENCOUNTER — Encounter (HOSPITAL_BASED_OUTPATIENT_CLINIC_OR_DEPARTMENT_OTHER): Payer: Self-pay | Admitting: Emergency Medicine

## 2023-06-16 DIAGNOSIS — A599 Trichomoniasis, unspecified: Secondary | ICD-10-CM | POA: Insufficient documentation

## 2023-06-16 DIAGNOSIS — R5383 Other fatigue: Secondary | ICD-10-CM | POA: Diagnosis present

## 2023-06-16 DIAGNOSIS — U071 COVID-19: Secondary | ICD-10-CM | POA: Insufficient documentation

## 2023-06-16 LAB — URINALYSIS, ROUTINE W REFLEX MICROSCOPIC
Bilirubin Urine: NEGATIVE
Glucose, UA: NEGATIVE mg/dL
Hgb urine dipstick: NEGATIVE
Ketones, ur: NEGATIVE mg/dL
Nitrite: NEGATIVE
Protein, ur: NEGATIVE mg/dL
Specific Gravity, Urine: >=1.03 (ref 1.005–1.030)
pH: 7 (ref 5.0–8.0)

## 2023-06-16 LAB — URINALYSIS, MICROSCOPIC (REFLEX)

## 2023-06-16 LAB — WET PREP, GENITAL
Clue Cells Wet Prep HPF POC: NONE SEEN
Sperm: NONE SEEN
WBC, Wet Prep HPF POC: <10 (ref <10)
Yeast Wet Prep HPF POC: NONE SEEN

## 2023-06-16 LAB — RESP PANEL BY RT-PCR (RSV, FLU A&B, COVID)  RVPGX2
Influenza A by PCR: NEGATIVE
Influenza B by PCR: NEGATIVE
Resp Syncytial Virus by PCR: NEGATIVE
SARS Coronavirus 2 by RT PCR: POSITIVE — AB

## 2023-06-16 LAB — PREGNANCY, URINE: Preg Test, Ur: NEGATIVE

## 2023-06-16 MED ORDER — METRONIDAZOLE 500 MG PO TABS: 500.0000 mg | ORAL_TABLET | Freq: Two times a day (BID) | ORAL | 0 refills | Status: AC

## 2023-06-16 NOTE — ED Triage Notes (Addendum)
Pt reports urinary frequency, slight dysuria, some vaginal irritation; doesn't feel well in general, has fatigue and some congestion

## 2023-06-16 NOTE — ED Provider Notes (Signed)
EMERGENCY DEPARTMENT AT [facility] Provider Note   CSN: [id number] Arrival date & time: 06/16/23  1412     History  Chief Complaint  Patient presents with   Urinary Frequency    Chelsea Harper is a 42 y.o. female who presents to the ED with concerns for urinary frequency onset 2-3 days. Also notes malodorous vaginal discharge, suprapubic abdominal pain. No meds tried at home.  She notes that she has felt fatigue recently as well. Denies sick contacts. Denies abdominal pain, nausea, vomiting, dysuria, rhinorrhea, nasal congestion, cough, chest pain, shortness of breath.  Denies concerns for STDs at this time.   The history is provided by the patient. No language interpreter was used.       Home Medications Prior to Admission medications   Medication Sig Start Date End Date Taking? Authorizing Provider  metroNIDAZOLE (FLAGYL) 500 MG tablet Take 1 tablet (500 mg total) by mouth 2 (two) times daily. 06/16/23  Yes Brinley Treanor A, PA-C  buPROPion (WELLBUTRIN XL) 150 MG 24 hr tablet Take 150 mg by mouth daily.    [provider]  fluticasone (FLONASE) 50 MCG/ACT nasal spray Place 2 sprays into both nostrils daily for 6 days. 11/22/20 11/28/20  Farrel Gordon, PA-C  gabapentin (NEURONTIN) 300 MG capsule Take 300 mg by mouth daily.    [provider]  ibuprofen (ADVIL) 600 MG tablet Take 1 tablet (600 mg total) by mouth every 6 (six) hours as needed. 07/07/21   Arby Barrette, MD  norgestimate-ethinyl estradiol (ORTHO-CYCLEN,SPRINTEC,PREVIFEM) 0.25-35 MG-MCG tablet Take 1 tablet by mouth daily. 05/16/16   Aviva Signs, CNM  ondansetron (ZOFRAN ODT) 4 MG disintegrating tablet Take 1-2 tablets (4-8 mg total) by mouth every 8 (eight) hours as needed for nausea or vomiting. 01/09/17   Ward, Layla Maw, DO  ondansetron (ZOFRAN ODT) 4 MG disintegrating tablet Take 1 tablet (4 mg total) by mouth every 4 (four) hours as needed for nausea or vomiting. 07/07/21    Arby Barrette, MD      Allergies    Patient has no known allergies.    Review of Systems   Review of Systems  Genitourinary:  Positive for frequency.  All other systems reviewed and are negative.   Physical Exam Updated Vital Signs BP 137/83   Pulse 84   Temp (!) 96.9 F (36.1 C)   Resp 16   Ht 5' 6.5" (1.689 m)   Wt 131.5 kg   LMP 05/30/2023   SpO2 94%   BMI 46.11 kg/m  Physical Exam Vitals and nursing note reviewed.  Constitutional:      General: She is not in acute distress.    Appearance: She is not diaphoretic.  HENT:     Head: Normocephalic and atraumatic.     Mouth/Throat:     Pharynx: No oropharyngeal exudate.  Eyes:     General: No scleral icterus.    Conjunctiva/sclera: Conjunctivae normal.  Cardiovascular:     Rate and Rhythm: Normal rate and regular rhythm.     Pulses: Normal pulses.     Heart sounds: Normal heart sounds.  Pulmonary:     Effort: Pulmonary effort is normal. No respiratory distress.     Breath sounds: Normal breath sounds. No wheezing.  Abdominal:     General: Bowel sounds are normal.     Palpations: Abdomen is soft. There is no mass.     Tenderness: There is no abdominal tenderness. There is no guarding or rebound.  Comments: No abdominal TTP.   Musculoskeletal:        General: Normal range of motion.     Cervical back: Normal range of motion and neck supple.  Skin:    General: Skin is warm and dry.  Neurological:     Mental Status: She is alert.  Psychiatric:        Behavior: Behavior normal.     ED Results / Procedures / Treatments   Labs (all labs ordered are listed, but only abnormal results are displayed) Labs Reviewed  RESP PANEL BY RT-PCR (RSV, FLU A&B, COVID)  RVPGX2 - Abnormal; Notable for the following components:      Result Value   SARS Coronavirus 2 by RT PCR POSITIVE (*)    All other components within normal limits  WET PREP, GENITAL - Abnormal; Notable for the following components:   Trich, Wet Prep  PRESENT (*)    All other components within normal limits  URINALYSIS, ROUTINE W REFLEX MICROSCOPIC - Abnormal; Notable for the following components:   Leukocytes,Ua TRACE (*)    All other components within normal limits  URINALYSIS, MICROSCOPIC (REFLEX) - Abnormal; Notable for the following components:   Bacteria, UA RARE (*)    All other components within normal limits  PREGNANCY, URINE  GC/CHLAMYDIA PROBE AMP () NOT AT [facility]    EKG None  Radiology No results found.  Procedures Procedures    Medications Ordered in ED Medications - No data to display  ED Course/ Medical Decision Making/ A&P Clinical Course as of 06/16/23 1830  Sun Jun 16, 2023  1711 Offered pelvic exam, patient declined today and opted for self swab. [SB]  1711 SARS Coronavirus 2 by RT PCR(!): POSITIVE [SB]  1800 Trich, Wet Prep(!): PRESENT [SB]  1808 Discussed with patient lab findings. Answered all available questions. Pt appears safe for discharge at this time.  [SB]    Clinical Course User Index [SB] Geoffry Bannister A, PA-C                                 Medical Decision Making Amount and/or Complexity of Data Reviewed Labs: ordered. Decision-making details documented in ED Course.  Risk Prescription drug management.   Pt presents with concerns for urinary frequency, dysuria, vaginal irritation/malodorous discharge. Vital signs, pt afebrile. On exam, pt with Uvula midline without swelling. No posterior pharyngeal erythema or tonsillar exudate noted. Patent airway. Pt able to speak in clear complete sentences. Tolerating oral secretions. No acute cardiovascular, respiratory, abdominal exam findings. Differential diagnosis includes COVID, Flu, RSV, BV, vulvovaginal candidiasis, acute cystitis.    Labs:  I ordered, and personally interpreted labs.  The pertinent results include:   Pregnancy urine negative COVID swab positive Flu swab negative RSV swab negative Urinalysis with  leukocytosis otherwise unremarkable Wet prep positive for trich, otherwise negative GC/Chlam ordered with results pending at time of discharge.   Disposition: Presentation suspicious for COVID. Doubt concerns at this time for RSV, Flu, or PNA. Doubt concerns at this time for acute cystitis. Wet prep swab today notable for trich. Offered HIV and RPR testing, patient declined today. Pt with pending gonorrhea and chlamydia testing. After consideration of the diagnostic results and the patients response to treatment, I feel that the patient would benefit from Discharge home. Work note provided. Flagyl sent to patient pharmacy. Supportive care measures and strict return precautions discussed with patient at bedside. Pt acknowledges and verbalizes  understanding. Pt appears safe for discharge. Follow up as indicated in discharge paperwork.    This chart was dictated using voice recognition software, Dragon. Despite the best efforts of this provider to proofread and correct errors, errors may still occur which can change documentation meaning.   Final Clinical Impression(s) / ED Diagnoses Final diagnoses:  COVID-19  Trichomonas vaginalis infection    Rx / DC Orders ED Discharge Orders          Ordered    metroNIDAZOLE (FLAGYL) 500 MG tablet  2 times daily        06/16/23 1807              Nicolis Boody A, PA-C 06/16/23 1830    Pricilla Loveless, MD 06/19/23 [phone]

## 2023-06-16 NOTE — Discharge Instructions (Addendum)
It was a pleasure taking care of you today!  Your urinalysis didn't show any concerning emergent findings at this time. Your swab today showed concern for trichomoniasis. You have pending lab results for STI work-up.  You may see the results of your labs on MyChart.  You will be called if there are any concerning findings today with your pending lab results. You will be sent a prescription called Flagyl, ensure to complete the entire course of antibiotic prescribed. You may follow up with your primary care provider or Health Department if you are experiencing continued symptoms.  If you have future STI concerns you may follow-up with the [facility] [phone]).  This location provides testing and treatment for STI and prep services.  This is free for individuals without insurance.  Appointments are required and typically you can get in on the same day.  Ensure to practice safe sex with condom use.    Your Flu and RSV swab was negative today. The COVID swab was positive today.  You may take over-the-counter cough and cold medications as needed for your symptoms. Ensure to maintain fluid intake with tea, soup, broth, Pedialyte, Gatorade, water. Ensure to maintain good hand hygiene and wear your mask around others. You may follow-up with your primary care provider as needed.  Return to the Emergency Department if you are experiencing trouble breathing, worsening or increasing chest pain, decreased fluid intake or worsening symptoms.

## 2023-12-22 ENCOUNTER — Other Ambulatory Visit: Payer: Self-pay

## 2023-12-22 DIAGNOSIS — R509 Fever, unspecified: Secondary | ICD-10-CM | POA: Diagnosis present

## 2023-12-22 DIAGNOSIS — R110 Nausea: Secondary | ICD-10-CM | POA: Diagnosis not present

## 2023-12-22 DIAGNOSIS — M7918 Myalgia, other site: Secondary | ICD-10-CM | POA: Diagnosis not present

## 2023-12-22 DIAGNOSIS — J3489 Other specified disorders of nose and nasal sinuses: Secondary | ICD-10-CM | POA: Insufficient documentation

## 2023-12-22 NOTE — ED Triage Notes (Signed)
Pt states thinks she has the flu, exposure at her work.

## 2023-12-23 ENCOUNTER — Emergency Department (HOSPITAL_BASED_OUTPATIENT_CLINIC_OR_DEPARTMENT_OTHER)
Admission: EM | Admit: 2023-12-23 | Discharge: 2023-12-23 | Disposition: A | Discharge status: Home / Self Care | Payer: Managed Care, Other (non HMO) | Attending: Emergency Medicine | Admitting: Emergency Medicine

## 2023-12-23 ENCOUNTER — Encounter (HOSPITAL_BASED_OUTPATIENT_CLINIC_OR_DEPARTMENT_OTHER): Payer: Self-pay | Admitting: Emergency Medicine

## 2023-12-23 DIAGNOSIS — J111 Influenza due to unidentified influenza virus with other respiratory manifestations: Secondary | ICD-10-CM

## 2023-12-23 LAB — RESP PANEL BY RT-PCR (RSV, FLU A&B, COVID)  RVPGX2
Influenza A by PCR: NEGATIVE
Influenza B by PCR: NEGATIVE
Resp Syncytial Virus by PCR: NEGATIVE
SARS Coronavirus 2 by RT PCR: NEGATIVE

## 2023-12-23 MED ORDER — ACETAMINOPHEN 500 MG PO TABS
1000.0000 mg | ORAL_TABLET | Freq: Once | ORAL | Status: AC | PRN
  Administered 2023-12-23: 1000 mg via ORAL
  Filled 2023-12-23: qty 2

## 2023-12-23 NOTE — ED Provider Notes (Signed)
[facility] [employer] EMERGENCY DEPARTMENT AT [facility] Provider Note   CSN: [id number] Arrival date & time: 12/22/23  2258     History  Chief Complaint  Patient presents with   URI    Chelsea Harper is a 43 y.o. female.  Body aches, subjective fevers, rhinorrhea, nausea for a few days. Daughter sick recently. 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 No other known exposures. No travels   URI      Home Medications Prior to Admission medications   Medication Sig Start Date End Date Taking? Authorizing Provider  buPROPion (WELLBUTRIN XL) 150 MG 24 hr tablet Take 150 mg by mouth daily.    [provider]  fluticasone (FLONASE) 50 MCG/ACT nasal spray Place 2 sprays into both nostrils daily for 6 days. 11/22/20 11/28/20  Farrel Gordon, PA-C  gabapentin (NEURONTIN) 300 MG capsule Take 300 mg by mouth daily.    [provider]  ibuprofen (ADVIL) 600 MG tablet Take 1 tablet (600 mg total) by mouth every 6 (six) hours as needed. 07/07/21   Arby Barrette, MD  metroNIDAZOLE (FLAGYL) 500 MG tablet Take 1 tablet (500 mg total) by mouth 2 (two) times daily. 06/16/23   Blue, Soijett A, PA-C  norgestimate-ethinyl estradiol (ORTHO-CYCLEN,SPRINTEC,PREVIFEM) 0.25-35 MG-MCG tablet Take 1 tablet by mouth daily. 05/16/16   Aviva Signs, CNM  ondansetron (ZOFRAN ODT) 4 MG disintegrating tablet Take 1-2 tablets (4-8 mg total) by mouth every 8 (eight) hours as needed for nausea or vomiting. 01/09/17   Ward, Layla Maw, DO  ondansetron (ZOFRAN ODT) 4 MG disintegrating tablet Take 1 tablet (4 mg total) by mouth every 4 (four) hours as needed for nausea or vomiting. 07/07/21   Arby Barrette, MD      Allergies    Patient has no known allergies.    Review of Systems   Review of Systems  Physical Exam Updated Vital Signs BP (!) 153/104 (BP Location: Right Arm)   Pulse 85   Temp 98 F (36.7 C) (Oral)   Resp 20   Ht 5' 6.5" (1.689 m)   Wt 136.1 kg   LMP 12/23/2023   SpO2  97%   BMI 47.70 kg/m  Physical Exam Vitals and nursing note reviewed.  Constitutional:      Appearance: She is well-developed.  HENT:     Head: Normocephalic and atraumatic.  Cardiovascular:     Rate and Rhythm: Normal rate and regular rhythm.  Pulmonary:     Effort: No respiratory distress.     Breath sounds: No stridor.  Abdominal:     General: There is no distension.  Musculoskeletal:        General: No swelling or tenderness. Normal range of motion.     Cervical back: Normal range of motion.  Skin:    General: Skin is warm and dry.  Neurological:     Mental Status: She is alert.     ED Results / Procedures / Treatments   Labs (all labs ordered are listed, but only abnormal results are displayed) Labs Reviewed  RESP PANEL BY RT-PCR (RSV, FLU A&B, COVID)  RVPGX2    EKG None  Radiology No results found.  Procedures Procedures    Medications Ordered in ED Medications  acetaminophen (TYLENOL) tablet 1,000 mg (1,000 mg Oral Given 12/23/23 0005)    ED Course/ Medical Decision Making/ A&P  Medical Decision Making Risk OTC drugs.   Overall appears well. VS WNL. Negative for covid/flu/rsv. No indication for imaging or other workup, likely other viral syndrome. Stable for d/c.   Final Clinical Impression(s) / ED Diagnoses Final diagnoses:  Influenza-like illness    Rx / DC Orders ED Discharge Orders     None         Mekhi Lascola, Barbara Cower, MD 12/23/23 [phone]

## 2024-03-30 ENCOUNTER — Emergency Department (HOSPITAL_BASED_OUTPATIENT_CLINIC_OR_DEPARTMENT_OTHER)

## 2024-03-30 ENCOUNTER — Emergency Department (HOSPITAL_BASED_OUTPATIENT_CLINIC_OR_DEPARTMENT_OTHER)
Admission: EM | Admit: 2024-03-30 | Discharge: 2024-03-30 | Disposition: A | Discharge status: Home / Self Care | Attending: Emergency Medicine | Admitting: Emergency Medicine

## 2024-03-30 ENCOUNTER — Encounter (HOSPITAL_BASED_OUTPATIENT_CLINIC_OR_DEPARTMENT_OTHER): Payer: Self-pay

## 2024-03-30 ENCOUNTER — Other Ambulatory Visit: Payer: Self-pay

## 2024-03-30 DIAGNOSIS — J020 Streptococcal pharyngitis: Secondary | ICD-10-CM | POA: Insufficient documentation

## 2024-03-30 DIAGNOSIS — R059 Cough, unspecified: Secondary | ICD-10-CM | POA: Diagnosis present

## 2024-03-30 LAB — RESP PANEL BY RT-PCR (RSV, FLU A&B, COVID)  RVPGX2
Influenza A by PCR: NEGATIVE
Influenza B by PCR: NEGATIVE
Resp Syncytial Virus by PCR: NEGATIVE
SARS Coronavirus 2 by RT PCR: NEGATIVE

## 2024-03-30 LAB — CBC WITH DIFFERENTIAL/PLATELET
Abs Immature Granulocytes: 0.05 10*3/uL (ref 0.00–0.07)
Basophils Absolute: 0 10*3/uL (ref 0.0–0.1)
Basophils Relative: 0 %
Eosinophils Absolute: 0.5 10*3/uL (ref 0.0–0.5)
Eosinophils Relative: 4 %
HCT: 31 % — ABNORMAL LOW (ref 36.0–46.0)
Hemoglobin: 10 g/dL — ABNORMAL LOW (ref 12.0–15.0)
Immature Granulocytes: 0 %
Lymphocytes Relative: 29 %
Lymphs Abs: 3.4 10*3/uL (ref 0.7–4.0)
MCH: 24 pg — ABNORMAL LOW (ref 26.0–34.0)
MCHC: 32.3 g/dL (ref 30.0–36.0)
MCV: 74.5 fL — ABNORMAL LOW (ref 80.0–100.0)
Monocytes Absolute: 0.7 10*3/uL (ref 0.1–1.0)
Monocytes Relative: 6 %
Neutro Abs: 7 10*3/uL (ref 1.7–7.7)
Neutrophils Relative %: 61 %
Platelets: 377 10*3/uL (ref 150–400)
RBC: 4.16 MIL/uL (ref 3.87–5.11)
RDW: 16 % — ABNORMAL HIGH (ref 11.5–15.5)
WBC: 11.6 10*3/uL — ABNORMAL HIGH (ref 4.0–10.5)
nRBC: 0 % (ref 0.0–0.2)

## 2024-03-30 LAB — BASIC METABOLIC PANEL WITH GFR
Anion gap: 9 (ref 5–15)
BUN: 10 mg/dL (ref 6–20)
CO2: 27 mmol/L (ref 22–32)
Calcium: 8.6 mg/dL — ABNORMAL LOW (ref 8.9–10.3)
Chloride: 103 mmol/L (ref 98–111)
Creatinine, Ser: 1.06 mg/dL — ABNORMAL HIGH (ref 0.44–1.00)
GFR, Estimated: >60 mL/min (ref >60)
Glucose, Bld: 104 mg/dL — ABNORMAL HIGH (ref 70–99)
Potassium: 3.4 mmol/L — ABNORMAL LOW (ref 3.5–5.1)
Sodium: 139 mmol/L (ref 135–145)

## 2024-03-30 LAB — GROUP A STREP BY PCR: Group A Strep by PCR: DETECTED — AB

## 2024-03-30 MED ORDER — IOHEXOL 300 MG/ML  SOLN
100.0000 mL | Freq: Once | INTRAMUSCULAR | Status: AC | PRN
  Administered 2024-03-30: 80 mL via INTRAVENOUS

## 2024-03-30 MED ORDER — DEXAMETHASONE SODIUM PHOSPHATE 10 MG/ML IJ SOLN
10.0000 mg | Freq: Once | INTRAMUSCULAR | Status: AC
  Administered 2024-03-30: 10 mg via INTRAVENOUS
  Filled 2024-03-30: qty 1

## 2024-03-30 MED ORDER — PENICILLIN G BENZATHINE 1200000 UNIT/2ML IM SUSY
1.2000 10*6.[IU] | PREFILLED_SYRINGE | Freq: Once | INTRAMUSCULAR | Status: AC
  Administered 2024-03-30: 1.2 10*6.[IU] via INTRAMUSCULAR
  Filled 2024-03-30: qty 2

## 2024-03-30 NOTE — ED Triage Notes (Addendum)
 Sore throat, nasal drainage, and generally "uncomfortable" since Friday. States she can't lay down to sleep, because when she tries "I get this really weird sensation and I get right back up." Pt is able to answer questions without obvious difficulty and manage her own secretions. States her "thyroid is swollen".

## 2024-03-30 NOTE — ED Notes (Signed)
 Patient transported to CT

## 2024-03-30 NOTE — ED Provider Notes (Signed)
 [place] [facility] Provider Note   CSN: [id number] Arrival date & time: 03/30/24  4098     History Pt notes recent decline in kidney function Chief Complaint  Patient presents with   Sore Throat    Chelsea Harper is a 43 y.o. female.  Pt notes symptoms of cough, congestion, drainage started 5 days ago which she attributed to allergies. Notes nausea and one episode of vomiting 4 days ago which resolved. Then 3 days ago she felt like it was difficult to swallow d/t sore throat but she has been able to eat soft foods and drink normally. States she feels like her thyroid is swollen. Hasn't been able to sleep in 35 hrs d/t the discomfort and anxiety.  No diarrhea, no fevers, body aches, chills, SOB.    Sore Throat       Home Medications Prior to Admission medications   Medication Sig Start Date End Date Taking? Authorizing Provider  buPROPion (WELLBUTRIN XL) 150 MG 24 hr tablet Take 150 mg by mouth daily.    [provider]  fluticasone  (FLONASE ) 50 MCG/ACT nasal spray Place 2 sprays into both nostrils daily for 6 days. 11/22/20 11/28/20  Patel, Shalyn, PA-C  gabapentin (NEURONTIN) 300 MG capsule Take 300 mg by mouth daily.    [provider]  ibuprofen  (ADVIL ) 600 MG tablet Take 1 tablet (600 mg total) by mouth every 6 (six) hours as needed. 07/07/21   Wynetta Heckle, MD  metroNIDAZOLE  (FLAGYL ) 500 MG tablet Take 1 tablet (500 mg total) by mouth 2 (two) times daily. 06/16/23   Blue, Soijett A, PA-C  norgestimate -ethinyl estradiol  (ORTHO-CYCLEN,SPRINTEC,PREVIFEM) 0.25-35 MG-MCG tablet Take 1 tablet by mouth daily. 05/16/16   Harlee Lichtenstein, CNM  ondansetron  (ZOFRAN  ODT) 4 MG disintegrating tablet Take 1-2 tablets (4-8 mg total) by mouth every 8 (eight) hours as needed for nausea or vomiting. 01/09/17   Ward, Clover Dao, DO  ondansetron  (ZOFRAN  ODT) 4 MG disintegrating tablet Take 1 tablet (4 mg total) by mouth every 4 (four) hours  as needed for nausea or vomiting. 07/07/21   Wynetta Heckle, MD      Allergies    Patient has no known allergies.    Review of Systems   Review of Systems As in HPI Physical Exam Updated Vital Signs BP 137/78   Pulse 84   Temp 98.3 F (36.8 C) (Oral)   Resp 20   Ht 5' 6.5" (1.689 m)   Wt 133.8 kg   LMP 03/08/2024   SpO2 99%   BMI 46.90 kg/m  Physical Exam Constitutional:      General: She is not in acute distress.    Appearance: She is not ill-appearing.  HENT:     Mouth/Throat:     Mouth: Mucous membranes are moist. No oral lesions.     Pharynx: Oropharynx is clear. No pharyngeal swelling, oropharyngeal exudate, posterior oropharyngeal erythema or uvula swelling.     Tonsils: No tonsillar exudate.     Comments: Difficult to get clear view of posterior oropharynx due to anatomy Eyes:     Conjunctiva/sclera: Conjunctivae normal.  Neck:     Comments: Anterior right side of neck feels fuller with palpation when compared to left side Cardiovascular:     Rate and Rhythm: Normal rate and regular rhythm.     Heart sounds: No murmur heard. Pulmonary:     Effort: Pulmonary effort is normal. No respiratory distress.     Breath sounds: Normal breath  sounds.  Abdominal:     General: Bowel sounds are normal. There is no distension.     Palpations: Abdomen is soft.     Tenderness: There is no abdominal tenderness.  Skin:    General: Skin is warm and dry.     Findings: No rash.  Neurological:     General: No focal deficit present.     Mental Status: She is alert.  Psychiatric:        Mood and Affect: Mood normal.        Behavior: Behavior normal.     ED Results / Procedures / Treatments   Labs (all labs ordered are listed, but only abnormal results are displayed) Labs Reviewed  GROUP A STREP BY PCR - Abnormal; Notable for the following components:      Result Value   Group A Strep by PCR DETECTED (*)    All other components within normal limits  CBC WITH  DIFFERENTIAL/PLATELET - Abnormal; Notable for the following components:   WBC 11.6 (*)    Hemoglobin 10.0 (*)    HCT 31.0 (*)    MCV 74.5 (*)    MCH 24.0 (*)    RDW 16.0 (*)    All other components within normal limits  BASIC METABOLIC PANEL WITH GFR - Abnormal; Notable for the following components:   Potassium 3.4 (*)    Glucose, Bld 104 (*)    Creatinine, Ser 1.06 (*)    Calcium 8.6 (*)    All other components within normal limits  RESP PANEL BY RT-PCR (RSV, FLU A&B, COVID)  [id number]    EKG None  Radiology CT Soft Tissue Neck W Contrast Result Date: 03/30/2024 CLINICAL DATA:  Neck swelling and sore throat EXAM: CT NECK WITH CONTRAST TECHNIQUE: Multidetector CT imaging of the neck was performed using the standard protocol following the bolus administration of intravenous contrast. RADIATION DOSE REDUCTION: This exam was performed according to the departmental dose-optimization program which includes automated exposure control, adjustment of the mA and/or kV according to patient size and/or use of iterative reconstruction technique. CONTRAST:  80mL OMNIPAQUE  IOHEXOL  300 MG/ML  SOLN COMPARISON:  None Available. FINDINGS: Pharynx and larynx: Thickening of the tonsils diffusely.  No edema. Salivary glands: No evidence of inflammation or mass. Thyroid: 5.5 cm right thyroid nodule with tracheal deviation to the left. This is larger than measured on prior ultrasound Lymph nodes: Prominence of cervical lymph nodes in the bilateral neck without heterogeneity. Vascular: Unremarkable Limited intracranial: Unremarkable Visualized orbits: Unremarkable where covered Mastoids and visualized paranasal sinuses: Clear Skeleton: Extensive dental caries. Lower cervical disc degeneration. Upper chest: No acute finding IMPRESSION: Tonsillar and nodal thickening in the neck, correlate for URI/pharyngitis. No abscess. Dominant right lobe thyroid nodule measuring 5.5 cm with tracheal deviation and mild narrowing, nodule  larger than on ultrasound in 2021 when biopsy was recommended. Recommend sonographic follow-up. Electronically Signed   By: Ronnette Coke M.D.   On: 03/30/2024 09:44    Procedures Procedures  None  Medications Ordered in ED Medications  dexamethasone (DECADRON) injection 10 mg (10 mg Intravenous Given 03/30/24 0847)  iohexol  (OMNIPAQUE ) 300 MG/ML solution 100 mL (80 mLs Intravenous Contrast Given 03/30/24 0909)  penicillin g benzathine (BICILLIN LA) 1200000 UNIT/2ML injection 1.2 Million Units (1.2 Million Units Intramuscular Given 03/30/24 1035)    ED Course/ Medical Decision Making/ A&P  Medical Decision Making 43 year old female with PMH depression, sickle cell trait, gestational diabetes presents with nonspecific symptoms including mild occasional cough and congestion for 5 days, 1 episode of vomiting 4 days ago, and 3 days of sore throat and feeling of anterior neck swelling primarily on the right.  In the ED, vitals are stable, top portion of oropharynx clear with no obvious swelling, no erythema or exudate, uvula is midline but difficult to view entire posterior pharynx due to anatomy.  Right anterior neck does feel full when compared to left.  BMP with creatinine 1.06, appears to be around baseline, CBC notable for mild leukocytosis to 11.6, microcytic anemia with hemoglobin 10, RSV/flu/COVID neg, rapid strep positive CT soft tissue neck w/ contrast shows tonsillar and nodal thickening, no abscess, known right thyroid nodule 5.5 cm causing tracheal deviation and mild narrowing which appears larger than on previous ultrasound from 2021. Imaging ruled out peritonsillar or retropharyngeal abscess.  Symptoms likely related to strep pharyngitis.   The right-sided neck fullness is explained by increased size of R thyroid nodule.  Reassuringly, she is breathing comfortably on room air with good oxygen saturation despite tracheal narrowing and deviation on  imaging. Patient remained stable throughout ED visit did not require admission.  She was treated with IV Decadron  and IM penicillin  G and discharged with instructions to return to ED if she develops difficulty breathing and to follow-up with PCP for further evaluation of thyroid nodule and anemia.  Amount and/or Complexity of Data Reviewed Labs: ordered. Radiology: ordered.  Risk Prescription drug management.    Final Clinical Impression(s) / ED Diagnoses Final diagnoses:  Strep pharyngitis    Rx / DC Orders ED Discharge Orders     None         Glenn Lange, DO 03/30/24 1044    Hershel Los, MD 03/30/24 1054

## 2024-03-30 NOTE — Discharge Instructions (Addendum)
 You tested positive for strep throat.  The CT scan did not show an abscess but did show your known thyroid nodule has increased in size.  Your labs show that you are mildly anemic (low hemoglobin). You were treated with a steroid and one-time antibiotic injection in the ED Schedule an appointment with your PCP for further evaluation of your anemia and thyroid nodule You can take Tylenol  for pain and fever if it develops

## 2024-03-30 NOTE — ED Notes (Signed)
 ED Provider at bedside.

## 2024-03-30 NOTE — ED Provider Notes (Addendum)
 I saw and evaluated the patient, reviewed the resident's note and I agree with the findings and plan.    Patient presents with sore throat.  She feels like she has trouble breathing when she lays flat.  She feels like her throat swollen.  Throat exam was a bit difficult due to her body habitus.  Were able to see the superior aspect of the throat with a tonsillar pillars.  I do not appreciate any obvious peritonsillar fullness.  Will check CT.  Her creatinine is a little bit elevated but has had similar values in the past and has been referred to follow-up with nephrology.  Will need to have close f/u with her PCP regarding her thyroid enlargement   Hershel Los, MD 03/30/24 2130    Hershel Los, MD 03/30/24 1054

## 2024-06-13 ENCOUNTER — Emergency Department (HOSPITAL_BASED_OUTPATIENT_CLINIC_OR_DEPARTMENT_OTHER)

## 2024-06-13 ENCOUNTER — Other Ambulatory Visit: Payer: Self-pay

## 2024-06-13 ENCOUNTER — Encounter (HOSPITAL_BASED_OUTPATIENT_CLINIC_OR_DEPARTMENT_OTHER): Payer: Self-pay | Admitting: Emergency Medicine

## 2024-06-13 ENCOUNTER — Emergency Department (HOSPITAL_BASED_OUTPATIENT_CLINIC_OR_DEPARTMENT_OTHER)
Admission: EM | Admit: 2024-06-13 | Discharge: 2024-06-13 | Disposition: A | Discharge status: Home / Self Care | Attending: Emergency Medicine | Admitting: Emergency Medicine

## 2024-06-13 DIAGNOSIS — G500 Trigeminal neuralgia: Secondary | ICD-10-CM | POA: Diagnosis not present

## 2024-06-13 DIAGNOSIS — R519 Headache, unspecified: Secondary | ICD-10-CM | POA: Diagnosis present

## 2024-06-13 DIAGNOSIS — Z79899 Other long term (current) drug therapy: Secondary | ICD-10-CM | POA: Insufficient documentation

## 2024-06-13 LAB — URINALYSIS, ROUTINE W REFLEX MICROSCOPIC
Bilirubin Urine: NEGATIVE
Glucose, UA: NEGATIVE mg/dL
Hgb urine dipstick: NEGATIVE
Ketones, ur: NEGATIVE mg/dL
Nitrite: NEGATIVE
Protein, ur: NEGATIVE mg/dL
Specific Gravity, Urine: 1.015 (ref 1.005–1.030)
pH: 7 (ref 5.0–8.0)

## 2024-06-13 LAB — URINALYSIS, MICROSCOPIC (REFLEX)

## 2024-06-13 LAB — CBC WITH DIFFERENTIAL/PLATELET
Abs Immature Granulocytes: 0.04 K/uL (ref 0.00–0.07)
Basophils Absolute: 0 K/uL (ref 0.0–0.1)
Basophils Relative: 0 %
Eosinophils Absolute: 0.2 K/uL (ref 0.0–0.5)
Eosinophils Relative: 2 %
HCT: 30.8 % — ABNORMAL LOW (ref 36.0–46.0)
Hemoglobin: 9.8 g/dL — ABNORMAL LOW (ref 12.0–15.0)
Immature Granulocytes: 0 %
Lymphocytes Relative: 34 %
Lymphs Abs: 3.4 K/uL (ref 0.7–4.0)
MCH: 23.6 pg — ABNORMAL LOW (ref 26.0–34.0)
MCHC: 31.8 g/dL (ref 30.0–36.0)
MCV: 74 fL — ABNORMAL LOW (ref 80.0–100.0)
Monocytes Absolute: 0.5 K/uL (ref 0.1–1.0)
Monocytes Relative: 5 %
Neutro Abs: 6 K/uL (ref 1.7–7.7)
Neutrophils Relative %: 59 %
Platelets: 402 K/uL — ABNORMAL HIGH (ref 150–400)
RBC: 4.16 MIL/uL (ref 3.87–5.11)
RDW: 16.8 % — ABNORMAL HIGH (ref 11.5–15.5)
WBC: 10.2 K/uL (ref 4.0–10.5)
nRBC: 0 % (ref 0.0–0.2)

## 2024-06-13 LAB — COMPREHENSIVE METABOLIC PANEL WITH GFR
ALT: 14 U/L (ref 0–44)
AST: 17 U/L (ref 15–41)
Albumin: 3.7 g/dL (ref 3.5–5.0)
Alkaline Phosphatase: 105 U/L (ref 38–126)
Anion gap: 10 (ref 5–15)
BUN: 9 mg/dL (ref 6–20)
CO2: 26 mmol/L (ref 22–32)
Calcium: 8.7 mg/dL — ABNORMAL LOW (ref 8.9–10.3)
Chloride: 107 mmol/L (ref 98–111)
Creatinine, Ser: 1.15 mg/dL — ABNORMAL HIGH (ref 0.44–1.00)
GFR, Estimated: >60 mL/min (ref >60)
Glucose, Bld: 103 mg/dL — ABNORMAL HIGH (ref 70–99)
Potassium: 3.2 mmol/L — ABNORMAL LOW (ref 3.5–5.1)
Sodium: 143 mmol/L (ref 135–145)
Total Bilirubin: 0.3 mg/dL (ref 0.0–1.2)
Total Protein: 7.1 g/dL (ref 6.5–8.1)

## 2024-06-13 LAB — TSH: TSH: 0.713 u[IU]/mL (ref 0.350–4.500)

## 2024-06-13 LAB — PREGNANCY, URINE: Preg Test, Ur: NEGATIVE

## 2024-06-13 MED ORDER — ACETAMINOPHEN-CODEINE 300-30 MG PO TABS
2.0000 | ORAL_TABLET | Freq: Once | ORAL | Status: AC
  Administered 2024-06-13: 2 via ORAL
  Filled 2024-06-13: qty 2

## 2024-06-13 MED ORDER — BACLOFEN 10 MG PO TABS: 10.0000 mg | ORAL_TABLET | Freq: Three times a day (TID) | ORAL | 0 refills | Status: AC

## 2024-06-13 MED ORDER — POTASSIUM CHLORIDE CRYS ER 20 MEQ PO TBCR
40.0000 meq | EXTENDED_RELEASE_TABLET | Freq: Once | ORAL | Status: AC
  Administered 2024-06-13: 40 meq via ORAL
  Filled 2024-06-13: qty 2

## 2024-06-13 MED ORDER — BUTALBITAL-APAP-CAFFEINE 50-325-40 MG PO TABS
1.0000 | ORAL_TABLET | Freq: Once | ORAL | Status: DC
Start: 2024-06-13 — End: 2024-06-13

## 2024-06-13 NOTE — ED Provider Notes (Signed)
 [place] [facility] Provider Note   CSN: [id number] Arrival date & time: 06/13/24  1511     Patient presents with: Headache   Flara Storti is a 43 y.o. female with a 5-day history of worsening headaches.  Headaches are focused on the left occiput/mastoid region.  She had an ear infection 2 weeks ago, also review of her medical history does show that she has had ultrasound positive for enlarged thyroid /thyroid  nodule and has been recommended for biopsy of the same.  TSH and T4 were normal at that time.  This was done in June 2025.  She denies having any dizziness or vertigo, denies any otalgia, denies any recent falls or injuries.  Further denies have any nausea or vomiting and at presentation she states that she currently does not have a headache however has noticed that the headaches become more intermittent over the past several days.  Is unable to elaborate any exacerbating factors, states that occasional Tylenol  use does help relieve her pain.    Headache      Prior to Admission medications   Medication Sig Start Date End Date Taking? Authorizing Provider  baclofen  (LIORESAL ) 10 MG tablet Take 1 tablet (10 mg total) by mouth 3 (three) times daily. 06/13/24  Yes Myriam Dorn BROCKS, PA  buPROPion (WELLBUTRIN XL) 150 MG 24 hr tablet Take 150 mg by mouth daily.    [provider]  fluticasone  (FLONASE ) 50 MCG/ACT nasal spray Place 2 sprays into both nostrils daily for 6 days. 11/22/20 11/28/20  Patel, Shalyn, PA-C  gabapentin (NEURONTIN) 300 MG capsule Take 300 mg by mouth daily.    [provider]  ibuprofen  (ADVIL ) 600 MG tablet Take 1 tablet (600 mg total) by mouth every 6 (six) hours as needed. 07/07/21   Armenta Canning, MD  metroNIDAZOLE  (FLAGYL ) 500 MG tablet Take 1 tablet (500 mg total) by mouth 2 (two) times daily. 06/16/23   Blue, Soijett A, PA-C  norgestimate -ethinyl estradiol  (ORTHO-CYCLEN,SPRINTEC,PREVIFEM) 0.25-35 MG-MCG  tablet Take 1 tablet by mouth daily. 05/16/16   Trudy Earnie CROME, CNM  ondansetron  (ZOFRAN  ODT) 4 MG disintegrating tablet Take 1-2 tablets (4-8 mg total) by mouth every 8 (eight) hours as needed for nausea or vomiting. 01/09/17   Ward, Josette SAILOR, DO  ondansetron  (ZOFRAN  ODT) 4 MG disintegrating tablet Take 1 tablet (4 mg total) by mouth every 4 (four) hours as needed for nausea or vomiting. 07/07/21   Armenta Canning, MD    Allergies: Patient has no known allergies.    Review of Systems  Neurological:  Positive for headaches.  All other systems reviewed and are negative.   Updated Vital Signs BP (!) 179/92   Pulse 76   Temp 98.5 F (36.9 C)   Resp 20   Ht 5' 6.5 (1.689 m)   Wt 133.8 kg   SpO2 98%   BMI 46.90 kg/m   Physical Exam Vitals and nursing note reviewed.  Constitutional:      General: She is not in acute distress.    Appearance: Normal appearance.  HENT:     Head: Normocephalic and atraumatic.     Comments: Tenderness elicited to the base of the scalp in the left occiput and at the left mastoid.    Right Ear: Hearing, tympanic membrane, ear canal and external ear normal.     Left Ear: Hearing, tympanic membrane, ear canal and external ear normal.     Mouth/Throat:     Mouth: Mucous membranes are  moist.     Pharynx: Oropharynx is clear.  Eyes:     Extraocular Movements: Extraocular movements intact.     Conjunctiva/sclera: Conjunctivae normal.     Pupils: Pupils are equal, round, and reactive to light.  Cardiovascular:     Rate and Rhythm: Normal rate and regular rhythm.     Pulses: Normal pulses.     Heart sounds: Normal heart sounds. No murmur heard.    No friction rub. No gallop.  Pulmonary:     Effort: Pulmonary effort is normal.     Breath sounds: Normal breath sounds.  Abdominal:     General: Abdomen is flat. Bowel sounds are normal.     Palpations: Abdomen is soft.  Musculoskeletal:        General: Normal range of motion.     Cervical back: Normal  range of motion and neck supple.     Right lower leg: No edema.     Left lower leg: No edema.  Skin:    General: Skin is warm and dry.     Capillary Refill: Capillary refill takes less than 2 seconds.  Neurological:     General: No focal deficit present.     Mental Status: She is alert and oriented to person, place, and time. Mental status is at baseline.     GCS: GCS eye subscore is 4. GCS verbal subscore is 5. GCS motor subscore is 6.     Cranial Nerves: Cranial nerve deficit present.     Sensory: Sensation is intact.     Motor: Motor function is intact.     Coordination: Coordination is intact.     Gait: Gait is intact.     Comments: Slight deficit to the light sensation on the left side of the face otherwise cranial nerves II through XII are intact.  No focal motor deficits are appreciated.  Discrepancy to sensation on the left side of the face is elicited on the maxillary branch of the trigeminal nerve.  Psychiatric:        Mood and Affect: Mood normal.     (all labs ordered are listed, but only abnormal results are displayed) Labs Reviewed  CBC WITH DIFFERENTIAL/PLATELET - Abnormal; Notable for the following components:      Result Value   Hemoglobin 9.8 (*)    HCT 30.8 (*)    MCV 74.0 (*)    MCH 23.6 (*)    RDW 16.8 (*)    Platelets 402 (*)    All other components within normal limits  COMPREHENSIVE METABOLIC PANEL WITH GFR - Abnormal; Notable for the following components:   Potassium 3.2 (*)    Glucose, Bld 103 (*)    Creatinine, Ser 1.15 (*)    Calcium 8.7 (*)    All other components within normal limits  URINALYSIS, ROUTINE W REFLEX MICROSCOPIC - Abnormal; Notable for the following components:   Leukocytes,Ua TRACE (*)    All other components within normal limits  URINALYSIS, MICROSCOPIC (REFLEX) - Abnormal; Notable for the following components:   Bacteria, UA FEW (*)    All other components within normal limits  PREGNANCY, URINE  TSH  T4, FREE     EKG: None  Radiology: CT Head Wo Contrast Result Date: 06/13/2024 CLINICAL DATA:  Headache, increasing frequency or severity Pt reports HAs to LT posterior head x 4d; reports area is tender to touch; hx of LT ear infection about 1 wk ago; reports funny sensation to LT side of face EXAM: CT  HEAD WITHOUT CONTRAST TECHNIQUE: Contiguous axial images were obtained from the base of the skull through the vertex without intravenous contrast. RADIATION DOSE REDUCTION: This exam was performed according to the departmental dose-optimization program which includes automated exposure control, adjustment of the mA and/or kV according to patient size and/or use of iterative reconstruction technique. COMPARISON:  CT head 03/06/2019 FINDINGS: Brain: No evidence of large-territorial acute infarction. No parenchymal hemorrhage. No mass lesion. No extra-axial collection. No mass effect or midline shift. No hydrocephalus. Basilar cisterns are patent. Vascular: No hyperdense vessel. Skull: No acute fracture or focal lesion. Sinuses/Orbits: Paranasal sinuses and mastoid air cells are clear. The orbits are unremarkable. Other: None. IMPRESSION: No acute intracranial abnormality. Electronically Signed   By: Morgane  Naveau M.D.   On: 06/13/2024 19:09     Procedures   Medications Ordered in the ED  butalbital -acetaminophen -caffeine  (FIORICET) 50-325-40 MG per tablet 1 tablet (has no administration in time range)  potassium chloride  SA (KLOR-CON  M) CR tablet 40 mEq (40 mEq Oral Given 06/13/24 2106)                                    Medical Decision Making Amount and/or Complexity of Data Reviewed Labs: ordered. Radiology: ordered.  Risk Prescription drug management.  Medical Decision Making:   Tifanny Dollens is a 43 y.o. female who presented to the ED today with headache and left-sided facial numbness detailed above.    External chart has been reviewed including previous labs and imaging. Complete initial  physical exam performed, notably the patient  was alert and oriented in no apparent distress.  She has no acute headache upon presentation, and physical exam as noted..    Reviewed and confirmed nursing documentation for past medical history, family history, social history.    Initial Assessment:   With the patient's presentation of left-sided headache and facial numbness, most likely diagnosis is migraine headache and/or trigeminal neuralgia.  Further consider acute neurovascular insult, venous sinus thrombosis.   Initial Plan:  Obtain CT imaging of the head to rule out acute neurovascular insult and venous sinus thrombosis Screening labs including CBC and Metabolic panel to evaluate for infectious or metabolic etiology of disease.  Urinalysis with reflex culture ordered to evaluate for UTI or relevant urologic/nephrologic pathology.  Secondary to history of thyroid  nodule, assess TSH and free T4. Objective evaluation as below reviewed   Initial Study Results:   Laboratory  All laboratory results reviewed without evidence of clinically relevant pathology.   Exceptions include: Potassium 3.2, hemoglobin is 9.8  Radiology:  All images reviewed independently. Agree with radiology report at this time.   CT Head Wo Contrast Result Date: 06/13/2024 CLINICAL DATA:  Headache, increasing frequency or severity Pt reports HAs to LT posterior head x 4d; reports area is tender to touch; hx of LT ear infection about 1 wk ago; reports funny sensation to LT side of face EXAM: CT HEAD WITHOUT CONTRAST TECHNIQUE: Contiguous axial images were obtained from the base of the skull through the vertex without intravenous contrast. RADIATION DOSE REDUCTION: This exam was performed according to the departmental dose-optimization program which includes automated exposure control, adjustment of the mA and/or kV according to patient size and/or use of iterative reconstruction technique. COMPARISON:  CT head 03/06/2019  FINDINGS: Brain: No evidence of large-territorial acute infarction. No parenchymal hemorrhage. No mass lesion. No extra-axial collection. No mass effect or midline shift. No hydrocephalus. Basilar  cisterns are patent. Vascular: No hyperdense vessel. Skull: No acute fracture or focal lesion. Sinuses/Orbits: Paranasal sinuses and mastoid air cells are clear. The orbits are unremarkable. Other: None. IMPRESSION: No acute intracranial abnormality. Electronically Signed   By: Morgane  Naveau M.D.   On: 06/13/2024 19:09     Reassessment and Plan:   Thorough assessment of this patient showed that CT did not show any acute neurovascular insult or any other acute changes.  Lab evaluation did demonstrate a hypokalemia of 3.2 which was repleted with oral potassium.  She does have an hemoglobin of 9.8, this is stable to 2 months ago.  With the distribution of her symptoms, suspect possible trigeminal neuralgia, and will provide neurology referral for same along with initiating baclofen .  Discussed with the patient, she understands and agrees has no further concerns at this time.       Final diagnoses:  Trigeminal neuralgia pain    ED Discharge Orders          Ordered    Ambulatory referral to Neurology       Comments: An appointment is requested in approximately: 1 week   06/13/24 2126    baclofen  (LIORESAL ) 10 MG tablet  3 times daily        06/13/24 2126               Myriam Dorn BROCKS, [place] 06/13/24 2127    Geraldene Hamilton, MD 06/15/24 [phone]

## 2024-06-13 NOTE — ED Notes (Addendum)
 Up to b/r, getting urine sample, alert, NAD, calm, interactive, steady gait, no obvious guarding/ photophobia. EDPA into room.

## 2024-06-13 NOTE — ED Triage Notes (Addendum)
 Pt reports HAs to LT posterior head x 4d; reports area is tender to touch; hx of LT ear infection about 1 wk ago;  reports funny sensation to LT side of face (just the area by her mouth) intermittently since 0300; hypertensive in triage, no hx

## 2024-06-14 LAB — T4, FREE: Free T4: 0.9 ng/dL (ref 0.61–1.12)

## 2024-07-08 ENCOUNTER — Ambulatory Visit: Admitting: Neurology

## 2024-07-08 ENCOUNTER — Encounter: Payer: Self-pay | Admitting: Neurology

## 2024-07-08 NOTE — Progress Notes (Deleted)
 [facility]    Provider:  Dr Ines Requesting Provider: Myriam Dorn BROCKS, PA Primary Care Provider:  Evangelina Tinnie Norris, PA-C  CC:  ***  HPI:  Chelsea Harper is a 43 y.o. female here as requested by Myriam Dorn BROCKS, PA for worsening headaches. has History of gestational diabetes; AS (sickle cell trait) (HCC); Depression, recurrent (HCC); and History of gestational hypertension on their problem list. Presented t the ED with worsening headaches, left occiput/mastoid in the setting of an ear infection 2 weeks prior. Exam in the ED showed tenderness here Dr. Zackowski 06/13/2024. Decreased sensation V2 branch. They initiated baclofen  for possible Trigem Neuralgia.   Reviewed notes, labs and imaging from outside physicians, which showed ***  Review of Systems: Patient complains of symptoms per HPI as well as the following symptoms ***. Pertinent negatives and positives per HPI. All others negative.   Social History   Socioeconomic History   Marital status: Single    Spouse name: Not on file   Number of children: Not on file   Years of education: Not on file   Highest education level: Not on file  Occupational History   Not on file  Tobacco Use   Smoking status: Never   Smokeless tobacco: Never  Vaping Use   Vaping status: Never Used  Substance and Sexual Activity   Alcohol use: Yes    Comment: occ   Drug use: No   Sexual activity: Not on file  Other Topics Concern   Not on file  Social History Narrative   Not on file   Social Drivers of Health   Financial Resource Strain: Not on file  Food Insecurity: Not on file  Transportation Needs: Not on file  Physical Activity: Not on file  Stress: Not on file  Social Connections: Not on file  Intimate Partner Violence: Not on file    Family History  Problem Relation Age of Onset   Diabetes Mother    Heart disease Mother    Cancer Mother        breast   Hypertension Brother     Past  Medical History:  Diagnosis Date   History of gestational diabetes 10/05/2014    Clinic [facility] Prenatal Labs  Dating LMP Blood type: O/POS/-- (12/01 1436)  Genetic Screen  Quad:           Normal 10/14/2014 Antibody:NEG (12/01 1436)  Anatomic US   1/8 nml with limited views of heart => [ ]  f/u Rubella: 0.91 (12/01 1436)  GTT Early:153.  3hr: 88, 149, 157, 117    Third trimester: 106, 180, 178, 161 RPR: NON REAC (02/04 1718)   TDaP vaccine April 2016 HBsAg: NEGATIVE (12/01 143   History of gestational hypertension 02/28/2015    Patient Active Problem List   Diagnosis Date Noted   History of gestational hypertension 02/28/2015   AS (sickle cell trait) (HCC) 11/11/2014   History of gestational diabetes 10/05/2014   Depression, recurrent (HCC) 08/06/2014    Past Surgical History:  Procedure Laterality Date   NO PAST SURGERIES      Current Outpatient Medications  Medication Sig Dispense Refill   baclofen  (LIORESAL ) 10 MG tablet Take 1 tablet (10 mg total) by mouth 3 (three) times daily. 30 each 0   buPROPion (WELLBUTRIN XL) 150 MG 24 hr tablet Take 150 mg by mouth daily.     fluticasone  (FLONASE ) 50 MCG/ACT nasal spray Place 2 sprays into both nostrils daily for 6 days. 9.9 mL 0  gabapentin (NEURONTIN) 300 MG capsule Take 300 mg by mouth daily.     ibuprofen  (ADVIL ) 600 MG tablet Take 1 tablet (600 mg total) by mouth every 6 (six) hours as needed. 30 tablet 0   metroNIDAZOLE  (FLAGYL ) 500 MG tablet Take 1 tablet (500 mg total) by mouth 2 (two) times daily. 14 tablet 0   norgestimate -ethinyl estradiol  (ORTHO-CYCLEN,SPRINTEC,PREVIFEM) 0.25-35 MG-MCG tablet Take 1 tablet by mouth daily. 1 Package 11   ondansetron  (ZOFRAN  ODT) 4 MG disintegrating tablet Take 1-2 tablets (4-8 mg total) by mouth every 8 (eight) hours as needed for nausea or vomiting. 20 tablet 0   ondansetron  (ZOFRAN  ODT) 4 MG disintegrating tablet Take 1 tablet (4 mg total) by mouth every 4 (four) hours as needed for nausea or  vomiting. 20 tablet 0   No current facility-administered medications for this visit.    Allergies as of 07/08/2024   (No Known Allergies)    Vitals: There were no vitals taken for this visit. Last Weight:  Wt Readings from Last 1 Encounters:  06/13/24 295 lb (133.8 kg)   Last Height:   Ht Readings from Last 1 Encounters:  06/13/24 5' 6.5 (1.689 m)     Physical exam: Exam: Gen: NAD, conversant, well nourised, obese, well groomed                     CV: RRR, no MRG. No Carotid Bruits. No peripheral edema, warm, nontender Eyes: Conjunctivae clear without exudates or hemorrhage  Neuro: Detailed Neurologic Exam  Speech:    Speech is normal; fluent and spontaneous with normal comprehension.  Cognition:    The patient is oriented to person, place, and time;     recent and remote memory intact;     language fluent;     normal attention, concentration,     fund of knowledge Cranial Nerves:    The pupils are equal, round, and reactive to light. The fundi are normal and spontaneous venous pulsations are present. Visual fields are full to finger confrontation. Extraocular movements are intact. Trigeminal sensation is intact and the muscles of mastication are normal. The face is symmetric. The palate elevates in the midline. Hearing intact. Voice is normal. Shoulder shrug is normal. The tongue has normal motion without fasciculations.   Coordination:    Normal finger to nose and heel to shin. Normal rapid alternating movements.   Gait:    Heel-toe and tandem gait are normal.   Motor Observation:    No asymmetry, no atrophy, and no involuntary movements noted. Tone:    Normal muscle tone.    Posture:    Posture is normal. normal erect    Strength:    Strength is V/V in the upper and lower limbs.      Sensation: intact to LT     Reflex Exam:  DTR's:    Deep tendon reflexes in the upper and lower extremities are normal bilaterally.   Toes:    The toes are downgoing  bilaterally.   Clonus:    Clonus is absent.    Assessment/Plan:    No orders of the defined types were placed in this encounter.  No orders of the defined types were placed in this encounter.   Cc: Myriam Dorn BROCKS, PA,  Evangelina Tinnie Norris, PA-C  Onetha Epp, MD  [facility] [address] [place], [place] [address]  Phone [phone] Fax [phone]

## 2024-09-29 ENCOUNTER — Ambulatory Visit: Admitting: Neurology

## 2024-11-20 ENCOUNTER — Encounter: Payer: Self-pay | Admitting: Diagnostic Neuroimaging

## 2024-11-20 ENCOUNTER — Ambulatory Visit: Admitting: Diagnostic Neuroimaging
